# Patient Record
Sex: Male | Born: 1955 | Hispanic: Yes | Smoking: Former smoker
Health system: Southern US, Community
[De-identification: ages and names within clinical notes are randomized; demographics above are authoritative.]

## PROBLEM LIST (undated history)

## (undated) DIAGNOSIS — K219 Gastro-esophageal reflux disease without esophagitis: Secondary | ICD-10-CM

## (undated) DIAGNOSIS — E785 Hyperlipidemia, unspecified: Secondary | ICD-10-CM

## (undated) DIAGNOSIS — A048 Other specified bacterial intestinal infections: Secondary | ICD-10-CM

## (undated) DIAGNOSIS — E119 Type 2 diabetes mellitus without complications: Secondary | ICD-10-CM

## (undated) DIAGNOSIS — N400 Enlarged prostate without lower urinary tract symptoms: Secondary | ICD-10-CM

## (undated) DIAGNOSIS — M109 Gout, unspecified: Secondary | ICD-10-CM

## (undated) DIAGNOSIS — I10 Essential (primary) hypertension: Secondary | ICD-10-CM

## (undated) HISTORY — DX: Gout, unspecified: M10.9

## (undated) HISTORY — DX: Type 2 diabetes mellitus without complications: E11.9

## (undated) HISTORY — DX: Benign prostatic hyperplasia without lower urinary tract symptoms: N40.0

## (undated) HISTORY — DX: Hyperlipidemia, unspecified: E78.5

## (undated) HISTORY — DX: Essential (primary) hypertension: I10

## (undated) HISTORY — PX: NO PAST SURGERIES: SHX2092

## (undated) HISTORY — DX: Gastro-esophageal reflux disease without esophagitis: K21.9

## (undated) HISTORY — DX: Other specified bacterial intestinal infections: A04.8

---

## 2008-09-16 ENCOUNTER — Emergency Department: Payer: Self-pay | Admitting: Internal Medicine

## 2015-04-06 ENCOUNTER — Emergency Department: Payer: Self-pay

## 2015-04-06 ENCOUNTER — Encounter: Payer: Self-pay | Admitting: Medical Oncology

## 2015-04-06 ENCOUNTER — Emergency Department
Admission: EM | Admit: 2015-04-06 | Discharge: 2015-04-06 | Disposition: A | Discharge status: Home / Self Care | Payer: Self-pay | Attending: Emergency Medicine | Admitting: Emergency Medicine

## 2015-04-06 DIAGNOSIS — R0789 Other chest pain: Secondary | ICD-10-CM | POA: Insufficient documentation

## 2015-04-06 LAB — BASIC METABOLIC PANEL
Anion gap: 8 (ref 5–15)
BUN: 15 mg/dL (ref 6–20)
CO2: 26 mmol/L (ref 22–32)
CREATININE: 0.84 mg/dL (ref 0.61–1.24)
Calcium: 9.5 mg/dL (ref 8.9–10.3)
Chloride: 103 mmol/L (ref 101–111)
Glucose, Bld: 140 mg/dL — ABNORMAL HIGH (ref 65–99)
POTASSIUM: 4.6 mmol/L (ref 3.5–5.1)
SODIUM: 137 mmol/L (ref 135–145)

## 2015-04-06 LAB — CBC
HCT: 46.2 % (ref 40.0–52.0)
Hemoglobin: 15.5 g/dL (ref 13.0–18.0)
MCH: 30.2 pg (ref 26.0–34.0)
MCHC: 33.6 g/dL (ref 32.0–36.0)
MCV: 89.7 fL (ref 80.0–100.0)
PLATELETS: 127 10*3/uL — AB (ref 150–440)
RBC: 5.14 MIL/uL (ref 4.40–5.90)
RDW: 13.3 % (ref 11.5–14.5)
WBC: 11.6 10*3/uL — AB (ref 3.8–10.6)

## 2015-04-06 LAB — TROPONIN I

## 2015-04-06 MED ORDER — IBUPROFEN 400 MG PO TABS: 600.0000 mg | ORAL_TABLET | Freq: Once | ORAL | Status: DC

## 2015-04-06 MED ORDER — IBUPROFEN 600 MG PO TABS: 600.0000 mg | ORAL_TABLET | Freq: Four times a day (QID) | ORAL | Status: DC | PRN

## 2015-04-06 MED ORDER — CYCLOBENZAPRINE HCL 10 MG PO TABS
10.0000 mg | ORAL_TABLET | Freq: Once | ORAL | Status: AC
  Administered 2015-04-06: 10 mg via ORAL

## 2015-04-06 MED ORDER — CYCLOBENZAPRINE HCL 10 MG PO TABS
ORAL_TABLET | ORAL | Status: AC
  Administered 2015-04-06: 10 mg via ORAL
  Filled 2015-04-06: qty 1

## 2015-04-06 MED ORDER — CYCLOBENZAPRINE HCL 10 MG PO TABS: 10.0000 mg | ORAL_TABLET | Freq: Three times a day (TID) | ORAL | Status: AC | PRN

## 2015-04-06 MED ORDER — IBUPROFEN 600 MG PO TABS
ORAL_TABLET | ORAL | Status: AC
  Filled 2015-04-06: qty 1

## 2015-04-06 NOTE — ED Provider Notes (Signed)
[facility] Emergency Department Provider Note  Time seen: 5:12 PM  I have reviewed the triage vital signs and the nursing notes.   HISTORY  Chief Complaint rib pain ; Flank Pain; and Chest Pain    HPI Frederick Clarke is a 60 y.o. male with no past medical history who presents the emergency department left-sided rib pain. 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 States the pain went away, however approximately 2 hours later he developed pain once again to the area which worsened to an 8/10. Today he states the pain continued it was improved currently 5/10, but still present. A friend told the patient that it could be his gallbladder so he came to the emergency department for evaluation. Patient denies any nausea, trouble breathing, diaphoresis. States the pain is worse if he pushes on the ribs.     History reviewed. No pertinent past medical history.  There are no active problems to display for this patient.   History reviewed. No pertinent past surgical history.  No current outpatient prescriptions on file.  Allergies Review of patient's allergies indicates no known allergies.  No family history on file.  Social History Social History  Substance Use Topics  . Smoking status: Never Smoker   . Smokeless tobacco: None  . Alcohol Use: No    Review of Systems Constitutional: Negative for fever. Cardiovascular: Left chest wall pain Respiratory: Negative for shortness of breath. Gastrointestinal: Negative for abdominal pain Musculoskeletal: Negative for back pain. Left chest wall pain. Neurological: Negative for headache 10-point ROS otherwise negative.  ____________________________________________   PHYSICAL EXAM:  VITAL SIGNS: ED Triage Vitals  Enc Vitals Group     BP 04/06/15 1629 151/85 mmHg     Pulse Rate 04/06/15  1629 74     Resp 04/06/15 1629 20     Temp 04/06/15 1629 97.5 F (36.4 C)     Temp Source 04/06/15 1629 Oral     SpO2 04/06/15 1629 99 %     Weight 04/06/15 1629 200 lb (90.719 kg)     Height 04/06/15 1629  (1.676 m)     Head Cir --      Peak Flow --      Pain Score 04/06/15 1630 7     Pain Loc --      Pain Edu? --      Excl. in GC? --     Constitutional: Alert and oriented. Well appearing and in no distress. Eyes: Normal exam ENT   Head: Normocephalic and atraumatic.   Mouth/Throat: Mucous membranes are moist. Cardiovascular: Normal rate, regular rhythm. No murmur Respiratory: Normal respiratory effort without tachypnea nor retractions. Breath sounds are clear. Moderate tenderness palpation to the left lateral lower rib. Gastrointestinal: Soft and nontender. No distention. Musculoskeletal: Nontender with normal range of motion in all extremities.  Neurologic:  Normal speech and language. No gross focal neurologic deficits Skin:  Skin is warm, dry and intact.  Psychiatric: Mood and affect are normal. Speech and behavior are normal.  ____________________________________________    EKG  EKG reviewed and interpreted by myself shows normal sinus rhythm at 70 bpm, narrow QRS, normal axis, normal intervals, no ST changes. Normal EKG.  ____________________________________________    RADIOLOGY  Chest x-ray shows no acute abnormality  INITIAL IMPRESSION / ASSESSMENT AND PLAN / ED COURSE  Pertinent labs & imaging results  that were available during my care of the patient were reviewed by me and considered in my medical decision making (see chart for details).  Patient presents with left lateral chest wall pain. Started yesterday when he lifted a heavy piece of carpet that is worsened overnight and continued today so he came to the emergency department for evaluation. He states a friend of his was concerned it could be the gallbladder however all the patient's pain is on  the left lateral ribs. It is moderately tender to palpation. Worse with movement per patient. Story and exam are very consistent with musculoskeletal pain. We'll dose Flexeril and Motrin. We will check labs as a precaution, and obtain a chest x-ray. EKG is reassuring.  Labs are within normal limits. We'll discharge patient home with Flexeril and ibuprofen. Likely musculoskeletal pain. Patient follow-up with primary care physician if the pain is not improved in 2-3 days. I discussed the normal chest pain return precautions with the patient.  ____________________________________________   FINAL CLINICAL IMPRESSION(S) / ED DIAGNOSES  Chest wall pain Musculoskeletal pain   Minna AntisKevin Kristofer Schaffert, MD 04/06/15 [phone]

## 2015-04-06 NOTE — ED Notes (Signed)
Pt discharged home after verbalizing understanding of discharge instructions; nad noted. 

## 2015-04-06 NOTE — Discharge Instructions (Signed)
You have been seen in the emergency department today for chest pain. Your workup has shown normal results, and your pain is likely due to muscle strain/spasm. As we discussed please follow-up with your primary care physician in the next 1-2 days for recheck. Return to the emergency department for any further chest pain, trouble breathing, or any other symptom personally concerning to yourself.  Chest Wall Pain Chest wall pain is pain in or around the bones and muscles of your chest. Sometimes, an injury causes this pain. Sometimes, the cause may not be known. This pain may take several weeks or longer to get better. HOME CARE INSTRUCTIONS  Pay attention to any changes in your symptoms. Take these actions to help with your pain:   Rest as told by your health care provider.   Avoid activities that cause pain. These include any activities that use your chest muscles or your abdominal and side muscles to lift heavy items.   If directed, apply ice to the painful area:  Put ice in a plastic bag.  Place a towel between your skin and the bag.  Leave the ice on for 20 minutes, 2-3 times per day.  Take over-the-counter and prescription medicines only as told by your health care provider.  Do not use tobacco products, including cigarettes, chewing tobacco, and e-cigarettes. If you need help quitting, ask your health care provider.  Keep all follow-up visits as told by your health care provider. This is important. SEEK MEDICAL CARE IF:  You have a fever.  Your chest pain becomes worse.  You have new symptoms. SEEK IMMEDIATE MEDICAL CARE IF:  You have nausea or vomiting.  You feel sweaty or light-headed.  You have a cough with phlegm (sputum) or you cough up blood.  You develop shortness of breath.   This information is not intended to replace advice given to you by your health care provider. Make sure you discuss any questions you have with your health care provider.   Document  Released: 01/08/2005 Document Revised: 09/29/2014 Document Reviewed: 04/05/2014 Elsevier Interactive Patient Education [employer]2016 Elsevier Inc.

## 2015-04-06 NOTE — ED Notes (Signed)
Pt reports left sided rib/flank pain that began today. Pt denies sob.

## 2015-04-06 NOTE — ED Notes (Signed)
Pt reports pain in left ribcage that was 8/10, but is now 5/10. States he also has numbness in tip of his tongue. Pt denies injury or trauma. NAD noted.

## 2020-10-13 ENCOUNTER — Encounter: Payer: Self-pay | Admitting: Gastroenterology

## 2020-10-27 ENCOUNTER — Ambulatory Visit: Payer: Self-pay | Admitting: Gastroenterology

## 2020-12-05 ENCOUNTER — Other Ambulatory Visit: Payer: Self-pay

## 2020-12-05 ENCOUNTER — Telehealth: Payer: Self-pay | Admitting: Gastroenterology

## 2020-12-05 ENCOUNTER — Encounter: Payer: Self-pay | Admitting: Gastroenterology

## 2020-12-05 ENCOUNTER — Ambulatory Visit (INDEPENDENT_AMBULATORY_CARE_PROVIDER_SITE_OTHER): Payer: 59 | Admitting: Gastroenterology

## 2020-12-05 VITALS — BP 120/64 | HR 64 | Ht 66.0 in | Wt 184.1 lb

## 2020-12-05 DIAGNOSIS — Z1212 Encounter for screening for malignant neoplasm of rectum: Secondary | ICD-10-CM

## 2020-12-05 DIAGNOSIS — R140 Abdominal distension (gaseous): Secondary | ICD-10-CM

## 2020-12-05 DIAGNOSIS — Z1211 Encounter for screening for malignant neoplasm of colon: Secondary | ICD-10-CM

## 2020-12-05 MED ORDER — POLYETHYLENE GLYCOL 3350 17 GM/SCOOP PO POWD: 1.0000 | Freq: Every day | ORAL | 3 refills | Status: DC

## 2020-12-05 MED ORDER — SUTAB 1479-225-188 MG PO TABS: 1.0000 | ORAL_TABLET | Freq: Once | ORAL | 0 refills | Status: AC

## 2020-12-05 NOTE — Patient Instructions (Signed)
If you are age 64 or older, your body mass index should be between 23-30. Your Body mass index is 29.72 kg/m. If this is out of the aforementioned range listed, please consider follow up with your Primary Care Provider.  If you are age 47 or younger, your body mass index should be between 19-25. Your Body mass index is 29.72 kg/m. If this is out of the aformentioned range listed, please consider follow up with your Primary Care Provider.   You have been scheduled for an endoscopy and colonoscopy. Please follow the written instructions given to you at your visit today. Please pick up your prep supplies at the pharmacy within the next 1-3 days. If you use inhalers (even only as needed), please bring them with you on the day of your procedure.   We have sent the following medications to your pharmacy for you to pick up at your convenience: Miralax 1 capful daily.  Start over the counter IB Guard.  The [facility] providers would like to encourage you to use [facility] to communicate with providers for non-urgent requests or questions.  Due to long hold times on the telephone, sending your provider a message by [facility] may be a faster and more efficient way to get a response.  Please allow 48 business hours for a response.  Please remember that this is for non-urgent requests.   It was a pleasure to see you today!  Thank you for trusting me with your gastrointestinal care!    Scott E.Tomasa Rand, MD

## 2020-12-05 NOTE — Progress Notes (Signed)
HPI : Frederick Clarke is a very pleasant 65 year old male with a history of diabetes (diet-controlled) referred to [place] by [facility] for further evaluation of abdominal bloating.  At the time of his visit, I have no records to review.  No documents were sent with his referral.  Per the patient, he was diagnosed with H. pylori about 2 months ago, and was treated with multiple antibiotics and reportedly had a negative stool antigen afterwards.  He has been having problems with abdominal bloating and shortness of breath for many months.  The symptoms are more prominent in the evening time when he is laying down.  He feels he cannot breathe well when he is laying flat and has to walk around at night.  Sometimes he feels like he is short of breath with walking around as well.  Although he feels bloated, he denies significant abdominal distention.  His belly is usually flat, although he notes that there is asymmetry of his abdomen, with perceived swelling on the right side. When asked if he has had a cardiac evaluation, he states that EGD was performed by his primary provider and was abnormal, and he is being referred to cardiology.  He has an appointment with them later this week. Although he reports difficulty with shortness of breath supine, he denies significant dyspnea with activity. He has had some problems with constipation, and is taking a fiber pill twice a day and takes milk of magnesia on occasion which works well for him.  It appears he was prescribed Linzess (listed medication) but he says he did not take this due to concern of side effects.  He takes Prilosec on occasion and thinks this may help some. The patient has lost about 20 pounds through improved diet which was recommended to control his diabetes. He has never had a colonoscopy or upper endoscopy.  His father had cancer in his liver, but he is not sure if it started there or in his stomach or  elsewhere.   Past Medical History:  Diagnosis Date   BPH (benign prostatic hyperplasia)    GERD (gastroesophageal reflux disease)    Gout    H. pylori infection    Hyperlipidemia    Type 2 diabetes mellitus (HCC)      Past Surgical History:  Procedure Laterality Date   NO PAST SURGERIES     Family History  Problem Relation Age of Onset   Diabetes Mother    Hypertension Mother    Liver cancer Father    Lung cancer Brother    Heart disease Daughter    Social History   Tobacco Use   Smoking status: Former    Types: Cigarettes    Quit date: 1993    Years since quitting: 29.8   Smokeless tobacco: Never  Vaping Use   Vaping Use: Never used  Substance Use Topics   Alcohol use: Yes    Comment: occasional   Current Outpatient Medications  Medication Sig Dispense Refill   tamsulosin (FLOMAX) 0.4 MG CAPS capsule Take 0.4 mg by mouth daily.     No current facility-administered medications for this visit.   Allergies  Allergen Reactions   Pineapple Itching     Review of Systems: All systems reviewed and negative except where noted in HPI.    No results found.  Physical Exam: BP 120/64 (BP Location: Left Arm, Patient Position: Sitting, Cuff Size: Normal)   Pulse 64   Ht 5\' 6"  (1.676 m)  Comment: from previous height  Wt 184 lb 2 oz (83.5 kg)   BMI 29.72 kg/m  Constitutional: Pleasant,well-developed, Frederick Clarke male in no acute distress. HEENT: Normocephalic and atraumatic. Conjunctivae are normal. No scleral icterus. Cardiovascular: Normal rate, regular rhythm.  Pulmonary/chest: Effort normal and breath sounds normal. No wheezing, rales or rhonchi. Abdominal: Soft, nondistended, nontender.  Very subtle asymmetry of the right and left abdomen.  Bowel sounds active throughout. There are no masses palpable. No hepatomegaly. Extremities: no edema Neurological: Alert and oriented to person place and time. Skin: Skin is warm and dry. No rashes noted. Psychiatric:  Normal mood and affect. Behavior is normal.  CBC    Component Value Date/Time   WBC 11.6 (H) 04/06/2015 1631   RBC 5.14 04/06/2015 1631   HGB 15.5 04/06/2015 1631   HCT 46.2 04/06/2015 1631   PLT 127 (L) 04/06/2015 1631   MCV 89.7 04/06/2015 1631   MCH 30.2 04/06/2015 1631   MCHC 33.6 04/06/2015 1631   RDW 13.3 04/06/2015 1631    CMP     Component Value Date/Time   NA 137 04/06/2015 1631   K 4.6 04/06/2015 1631   CL 103 04/06/2015 1631   CO2 26 04/06/2015 1631   GLUCOSE 140 (H) 04/06/2015 1631   BUN 15 04/06/2015 1631   CREATININE 0.84 04/06/2015 1631   CALCIUM 9.5 04/06/2015 1631   GFRNONAA >60 04/06/2015 1631   GFRAA >60 04/06/2015 1631     ASSESSMENT AND PLAN: 65 year old male with several months of bothersome bloating and orthopnea.  Unclear if the bloating is directly related to his breathing difficulty, given the absence of abdominal distention.  I have a hard time attributing all of his breathing difficulties solely on intestinal gas.  I recommended he try taking some IBgard in the evenings before bed and improve his bowel regimen by adding daily MiraLAX in the morning.  Continue taking his fiber supplements as he is doing.  He needs a colonoscopy for colon cancer screening, but given his new onset upper GI symptoms, I also recommend an upper endoscopy to exclude dysplasia and reconfirm eradication of H. pylori.  We will request records from his primary provider for further details about his medical history and previous medications.  Given the concern for a possible underlying cardiac problem (dyspnea with abnormal EKG), I recommended we not perform his endoscopic procedures until his cardiac evaluation is complete.  Bloating - IBgard nightly - MiraLAX daily - EGD  Colon cancer screening - Screening colonoscopy  Dyspnea/abnormal EKG - Obtain records from primary care - Await cardiology recommendations - Endoscopic procedures to be performed after cardiac  evaluation completed  The details, risks (including bleeding, perforation, infection, missed lesions, medication reactions and possible hospitalization or surgery if complications occur), benefits, and alternatives to colonoscopy with possible biopsy and possible polypectomy were discussed with the patient and he consents to proceed.   Maricella Filyaw E. Tomasa Rand, MD [facility]   No ref. provider found

## 2020-12-05 NOTE — Telephone Encounter (Signed)
Frederick Clarke, from [employer], returned your call.  He said to please try the following number:  [phone]   And that hopefully you'd get through easier.  Thank you.

## 2020-12-05 NOTE — Telephone Encounter (Signed)
Spoke with pharmacy and clarified instructions for miralax.

## 2020-12-05 NOTE — Telephone Encounter (Signed)
Received a call from [facility] requesting clarification on dosage of Miralax prescription sent.  Please call.  [phone] or [phone]  Thank you.

## 2020-12-08 ENCOUNTER — Ambulatory Visit: Payer: 59 | Admitting: Cardiology

## 2020-12-08 ENCOUNTER — Other Ambulatory Visit: Payer: Self-pay

## 2020-12-08 ENCOUNTER — Encounter: Payer: Self-pay | Admitting: Cardiology

## 2020-12-08 VITALS — BP 142/80 | HR 68 | Ht 66.0 in | Wt 183.0 lb

## 2020-12-08 DIAGNOSIS — R072 Precordial pain: Secondary | ICD-10-CM | POA: Diagnosis not present

## 2020-12-08 DIAGNOSIS — R0609 Other forms of dyspnea: Secondary | ICD-10-CM

## 2020-12-08 DIAGNOSIS — R030 Elevated blood-pressure reading, without diagnosis of hypertension: Secondary | ICD-10-CM | POA: Diagnosis not present

## 2020-12-08 DIAGNOSIS — R3589 Other polyuria: Secondary | ICD-10-CM | POA: Diagnosis not present

## 2020-12-08 MED ORDER — METOPROLOL TARTRATE 100 MG PO TABS: 100.0000 mg | ORAL_TABLET | Freq: Once | ORAL | 0 refills | Status: DC

## 2020-12-08 NOTE — Patient Instructions (Signed)
Medication Instructions:  Your physician recommends that you continue on your current medications as directed. Please refer to the Current Medication list given to you today.  *If you need a refill on your cardiac medications before your next appointment, please call your pharmacy*   Lab Work:  BMP drawn in office today    Testing/Procedures:   Your physician has requested that you have an echocardiogram. Echocardiography is a painless test that uses sound waves to create images of your heart. It provides your doctor with information about the size and shape of your heart and how well your heart's chambers and valves are working. This procedure takes approximately one hour. There are no restrictions for this procedure.  2.  Your physician has requested that you have cardiac CT. Cardiac computed tomography (CT) is a painless test that uses an x-ray machine to take clear, detailed pictures of your heart.    Your cardiac CT will be scheduled at:  [facility] [address] [place], [place] [address] [phone]   Thursday December 1st at 10:15   Please arrive 15 mins early for check-in and test prep.   Please follow these instructions carefully (unless otherwise directed):    On the Night Before the Test: Be sure to Drink plenty of water. Do not consume any caffeinated/decaffeinated beverages or chocolate 12 hours prior to your test.    On the Day of the Test: Drink plenty of water until 1 hour prior to the test. Do not eat any food 4 hours prior to the test. You may take your regular medications prior to the test.  Take metoprolol (Lopressor) 100 MG two hours prior to test.    After the Test: Drink plenty of water. After receiving IV contrast, you may experience a mild flushed feeling. This is normal. On occasion, you may experience a mild rash up to 24 hours after the test. This is not dangerous. If this occurs, you  can take Benadryl 25 mg and increase your fluid intake. If you experience trouble breathing, this can be serious. If it is severe call 911 IMMEDIATELY. If it is mild, please call our office. If you take any of these medications: Glipizide/Metformin, Avandament, Glucavance, please do not take 48 hours after completing test unless otherwise instructed.  Please allow 2-4 weeks for scheduling of routine cardiac CTs. Some insurance companies require a pre-authorization which may delay scheduling of this test.   For non-scheduling related questions, please contact the cardiac imaging nurse navigator should you have any questions/concerns: Rockwell Alexandria, Cardiac Imaging Nurse Navigator Larey Brick, Cardiac Imaging Nurse Navigator [facility] Direct Office Dial: [phone]   For scheduling needs, including cancellations and rescheduling, please call [place], [phone].      Follow-Up: At [facility], you and your health needs are our priority.  As part of our continuing mission to provide you with exceptional heart care, we have created designated Provider Care Teams.  These Care Teams include your primary Cardiologist (physician) and Advanced Practice Providers (APPs -  Physician Assistants and Nurse Practitioners) who all work together to provide you with the care you need, when you need it.  We recommend signing up for the patient portal called "MyChart".  Sign up information is provided on this After Visit Summary.  MyChart is used to connect with patients for Virtual Visits (Telemedicine).  Patients are able to view lab/test results, encounter notes, upcoming appointments, etc.  Non-urgent messages can be sent  to your provider as well.   To learn more about what you can do with MyChart, go to [email or web address].    Your next appointment:    Follow up after testing  ([facility] on 12/22/20)  The format for your next appointment:   In Person  Provider:    You may see Dr. Azucena Cecil or one of the following Advanced Practice Providers on your designated Care Team:   Nicolasa Ducking, NP Eula Listen, PA-C Cadence Fransico Michael, [place]    Other Instructions

## 2020-12-08 NOTE — Progress Notes (Signed)
Cardiology Office Note:    Date:  12/08/2020   ID:  Frederick Clarke, DOB [birth year], MRN [id number]  PCP:  Patient, No Pcp Per (Inactive)   [facility] Providers Cardiologist:  None     Referring MD: [name], PA   Chief Complaint  Patient presents with   New Patient (Initial Visit)    Referred by PCP for chest pain. Patient c.o SOB. Meds reviewed verbally with patient.    Frederick Clarke is a 65 y.o. male who is being seen today for the evaluation of chest pain at the request of [name], [place].   History of Present Illness:    Frederick Clarke is a 65 y.o. male with a hx of diabetes, former smoker x15 years who presents due to chest pain and shortness of breath.  Patient recently diagnosed with H. pylori about 3 months ago.  Underwent 2-week treatment with antibiotics.  States having shortness of breath with minimal exertion prior to abdominal distention or bloating leading to diagnosis of H. pylori.  Shortness of breath has persisted despite treatment with antibiotics.  He also has left-sided chest discomfort not associated with exertion.  Sometimes reproducible with palpation and sometimes not.   Takes metformin for diabetes control.  States blood sugars typically in the 300 range, endorses urinating very frequenly.   Past Medical History:  Diagnosis Date   BPH (benign prostatic hyperplasia)    GERD (gastroesophageal reflux disease)    Gout    H. pylori infection    Hyperlipidemia    Type 2 diabetes mellitus (HCC)     Past Surgical History:  Procedure Laterality Date   NO PAST SURGERIES      Current Medications: Current Meds  Medication Sig   metFORMIN (GLUCOPHAGE) 500 MG tablet Take 500 mg by mouth 2 (two) times daily with a meal.   metoprolol tartrate (LOPRESSOR) 100 MG tablet Take 1 tablet (100 mg total) by mouth once for 1 dose. Take 2 hours prior to your CT scan.   polyethylene glycol (MIRALAX / GLYCOLAX) 17 g packet Take 17 g by mouth daily  as needed.     Allergies:   Pineapple   Social History   Socioeconomic History   Marital status: Divorced    Spouse name: Not on file   Number of children: 6   Years of education: Not on file   Highest education level: Not on file  Occupational History   Occupation: carpet  Tobacco Use   Smoking status: Former    Types: Cigarettes    Quit date: 1993    Years since quitting: 29.8   Smokeless tobacco: Never  Vaping Use   Vaping Use: Never used  Substance and Sexual Activity   Alcohol use: Yes    Comment: occasional   Drug use: Not on file   Sexual activity: Not on file  Other Topics Concern   Not on file  Social History Narrative   Not on file   Social Determinants of Health   Financial Resource Strain: Not on file  Food Insecurity: Not on file  Transportation Needs: Not on file  Physical Activity: Not on file  Stress: Not on file  Social Connections: Not on file     Family History: The patient's family history includes Diabetes in his mother; Heart disease in his daughter; Hypertension in his mother; Liver cancer in his father; Lung cancer in his brother.  ROS:   Please see the history of present illness.     All  other systems reviewed and are negative.  EKGs/Labs/Other Studies Reviewed:    The following studies were reviewed today:   EKG:  EKG is  ordered today.  The ekg ordered today demonstrates normal sinus rhythm, normal ECG.  Recent Labs: No results found for requested labs within last 8760 hours.  Recent Lipid Panel No results found for: CHOL, TRIG, HDL, CHOLHDL, VLDL, LDLCALC, LDLDIRECT   Risk Assessment/Calculations:          Physical Exam:    VS:  BP (!) 142/80 (BP Location: Left Arm, Patient Position: Sitting, Cuff Size: Normal)   Pulse 68   Ht 5\' 6"  (1.676 m)   Wt 183 lb (83 kg)   SpO2 94%   BMI 29.54 kg/m     Wt Readings from Last 3 Encounters:  12/08/20 183 lb (83 kg)  12/05/20 184 lb 2 oz (83.5 kg)  04/06/15 200 lb (90.7  kg)     GEN:  Well nourished, well developed in no acute distress HEENT: Normal NECK: No JVD; No carotid bruits LYMPHATICS: No lymphadenopathy CARDIAC: RRR, no murmurs, rubs, gallops RESPIRATORY:  Clear to auscultation without rales, wheezing or rhonchi  ABDOMEN: Soft, non-tender, non-distended MUSCULOSKELETAL:  No edema; No deformity  SKIN: Warm and dry NEUROLOGIC:  Alert and oriented x 3 PSYCHIATRIC:  Normal affect   ASSESSMENT:    1. Precordial pain   2. Dyspnea on exertion   3. Polyuria   4. Elevated BP without diagnosis of hypertension    PLAN:    In order of problems listed above:  Chest pain, risk factors of diabetes, former smoker.  Get echocardiogram, get coronary CTA to evaluate presence of CAD.  If coronary CTA not approved by insurance, will plan Lexiscan Myoview. Dyspnea on exertion, echo and coronary CTA as above. Polyuria, elevated blood sugars.  Adequate management of diabetes as per PCP or endocrinology. BP elevated, usually controlled.  Continue to monitor.  Follow-up after echo and coronary CTA.  Spanish interpreter used for this office visit.         Medication Adjustments/Labs and Tests Ordered: Current medicines are reviewed at length with the patient today.  Concerns regarding medicines are outlined above.  Orders Placed This Encounter  Procedures   CT CORONARY MORPH W/CTA COR W/SCORE W/CA W/CM &/OR WO/CM   Basic metabolic panel   EKG 12-Lead   ECHOCARDIOGRAM COMPLETE   Meds ordered this encounter  Medications   metoprolol tartrate (LOPRESSOR) 100 MG tablet    Sig: Take 1 tablet (100 mg total) by mouth once for 1 dose. Take 2 hours prior to your CT scan.    Dispense:  1 tablet    Refill:  0    Patient Instructions  Medication Instructions:  Your physician recommends that you continue on your current medications as directed. Please refer to the Current Medication list given to you today.  *If you need a refill on your cardiac  medications before your next appointment, please call your pharmacy*   Lab Work:  BMP drawn in office today    Testing/Procedures:   Your physician has requested that you have an echocardiogram. Echocardiography is a painless test that uses sound waves to create images of your heart. It provides your doctor with information about the size and shape of your heart and how well your heart's chambers and valves are working. This procedure takes approximately one hour. There are no restrictions for this procedure.  2.  Your physician has requested that you have cardiac CT.  Cardiac computed tomography (CT) is a painless test that uses an x-ray machine to take clear, detailed pictures of your heart.    Your cardiac CT will be scheduled at:  [facility] [address] [place], [place] [address] [phone]   Thursday December 1st at 10:15   Please arrive 15 mins early for check-in and test prep.   Please follow these instructions carefully (unless otherwise directed):    On the Night Before the Test: Be sure to Drink plenty of water. Do not consume any caffeinated/decaffeinated beverages or chocolate 12 hours prior to your test.    On the Day of the Test: Drink plenty of water until 1 hour prior to the test. Do not eat any food 4 hours prior to the test. You may take your regular medications prior to the test.  Take metoprolol (Lopressor) 100 MG two hours prior to test.    After the Test: Drink plenty of water. After receiving IV contrast, you may experience a mild flushed feeling. This is normal. On occasion, you may experience a mild rash up to 24 hours after the test. This is not dangerous. If this occurs, you can take Benadryl 25 mg and increase your fluid intake. If you experience trouble breathing, this can be serious. If it is severe call 911 IMMEDIATELY. If it is mild, please call our office. If you take any of these  medications: Glipizide/Metformin, Avandament, Glucavance, please do not take 48 hours after completing test unless otherwise instructed.  Please allow 2-4 weeks for scheduling of routine cardiac CTs. Some insurance companies require a pre-authorization which may delay scheduling of this test.   For non-scheduling related questions, please contact the cardiac imaging nurse navigator should you have any questions/concerns: Rockwell Alexandria, Cardiac Imaging Nurse Navigator Larey Brick, Cardiac Imaging Nurse Navigator  [facility] Direct Office Dial: [phone]   For scheduling needs, including cancellations and rescheduling, please call [place], [phone].      Follow-Up: At [facility], you and your health needs are our priority.  As part of our continuing mission to provide you with exceptional heart care, we have created designated Provider Care Teams.  These Care Teams include your primary Cardiologist (physician) and Advanced Practice Providers (APPs -  Physician Assistants and Nurse Practitioners) who all work together to provide you with the care you need, when you need it.  We recommend signing up for the patient portal called "MyChart".  Sign up information is provided on this After Visit Summary.  MyChart is used to connect with patients for Virtual Visits (Telemedicine).  Patients are able to view lab/test results, encounter notes, upcoming appointments, etc.  Non-urgent messages can be sent to your provider as well.   To learn more about what you can do with MyChart, go to [email or web address].    Your next appointment:    Follow up after testing  (CCTA on 12/22/20)  The format for your next appointment:   In Person  Provider:   You may see Dr. Azucena Cecil or one of the following Advanced Practice Providers on your designated Care Team:   Nicolasa Ducking, NP Eula Listen, PA-C Cadence Fransico Michael, [place]    Other Instructions    Signed, Debbe Odea, MD  12/08/2020 11:56 AM    [facility]

## 2020-12-09 ENCOUNTER — Other Ambulatory Visit: Payer: Self-pay

## 2020-12-09 ENCOUNTER — Emergency Department (HOSPITAL_COMMUNITY)
Admission: EM | Admit: 2020-12-09 | Discharge: 2020-12-10 | Disposition: A | Discharge status: Home / Self Care | Payer: 59 | Attending: Emergency Medicine | Admitting: Emergency Medicine

## 2020-12-09 ENCOUNTER — Encounter (HOSPITAL_COMMUNITY): Payer: Self-pay | Admitting: Emergency Medicine

## 2020-12-09 DIAGNOSIS — Z87891 Personal history of nicotine dependence: Secondary | ICD-10-CM | POA: Insufficient documentation

## 2020-12-09 DIAGNOSIS — R739 Hyperglycemia, unspecified: Secondary | ICD-10-CM

## 2020-12-09 DIAGNOSIS — Z7984 Long term (current) use of oral hypoglycemic drugs: Secondary | ICD-10-CM | POA: Insufficient documentation

## 2020-12-09 DIAGNOSIS — E1165 Type 2 diabetes mellitus with hyperglycemia: Secondary | ICD-10-CM | POA: Insufficient documentation

## 2020-12-09 DIAGNOSIS — R1013 Epigastric pain: Secondary | ICD-10-CM | POA: Insufficient documentation

## 2020-12-09 LAB — BASIC METABOLIC PANEL
BUN/Creatinine Ratio: 19 (ref 10–24)
BUN: 16 mg/dL (ref 8–27)
CO2: 24 mmol/L (ref 20–29)
Calcium: 9.9 mg/dL (ref 8.6–10.2)
Chloride: 96 mmol/L (ref 96–106)
Creatinine, Ser: 0.85 mg/dL (ref 0.76–1.27)
Glucose: 312 mg/dL — ABNORMAL HIGH (ref 70–99)
Potassium: 4.7 mmol/L (ref 3.5–5.2)
Sodium: 136 mmol/L (ref 134–144)
eGFR: 97 mL/min/{1.73_m2} (ref >59)

## 2020-12-09 LAB — COMPREHENSIVE METABOLIC PANEL
ALT: 17 U/L (ref 0–44)
AST: 19 U/L (ref 15–41)
Albumin: 4.3 g/dL (ref 3.5–5.0)
Alkaline Phosphatase: 117 U/L (ref 38–126)
Anion gap: 10 (ref 5–15)
BUN: 20 mg/dL (ref 8–23)
CO2: 26 mmol/L (ref 22–32)
Calcium: 9.5 mg/dL (ref 8.9–10.3)
Chloride: 97 mmol/L — ABNORMAL LOW (ref 98–111)
Creatinine, Ser: 0.72 mg/dL (ref 0.61–1.24)
GFR, Estimated: >60 mL/min (ref >60)
Glucose, Bld: 246 mg/dL — ABNORMAL HIGH (ref 70–99)
Potassium: 3.7 mmol/L (ref 3.5–5.1)
Sodium: 133 mmol/L — ABNORMAL LOW (ref 135–145)
Total Bilirubin: 0.6 mg/dL (ref 0.3–1.2)
Total Protein: 8 g/dL (ref 6.5–8.1)

## 2020-12-09 LAB — CBG MONITORING, ED: Glucose-Capillary: 251 mg/dL — ABNORMAL HIGH (ref 70–99)

## 2020-12-09 LAB — URINALYSIS, ROUTINE W REFLEX MICROSCOPIC
Bacteria, UA: NONE SEEN
Bilirubin Urine: NEGATIVE
Glucose, UA: >=500 mg/dL — AB
Hgb urine dipstick: NEGATIVE
Ketones, ur: 5 mg/dL — AB
Nitrite: NEGATIVE
Protein, ur: NEGATIVE mg/dL
Specific Gravity, Urine: 1.017 (ref 1.005–1.030)
pH: 5 (ref 5.0–8.0)

## 2020-12-09 LAB — CBC WITH DIFFERENTIAL/PLATELET
Abs Immature Granulocytes: 0.01 10*3/uL (ref 0.00–0.07)
Basophils Absolute: 0 10*3/uL (ref 0.0–0.1)
Basophils Relative: 0 %
Eosinophils Absolute: 0 10*3/uL (ref 0.0–0.5)
Eosinophils Relative: 1 %
HCT: 44.6 % (ref 39.0–52.0)
Hemoglobin: 15.6 g/dL (ref 13.0–17.0)
Immature Granulocytes: 0 %
Lymphocytes Relative: 40 %
Lymphs Abs: 2.9 10*3/uL (ref 0.7–4.0)
MCH: 30.3 pg (ref 26.0–34.0)
MCHC: 35 g/dL (ref 30.0–36.0)
MCV: 86.6 fL (ref 80.0–100.0)
Monocytes Absolute: 0.6 10*3/uL (ref 0.1–1.0)
Monocytes Relative: 8 %
Neutro Abs: 3.7 10*3/uL (ref 1.7–7.7)
Neutrophils Relative %: 51 %
Platelets: 155 10*3/uL (ref 150–400)
RBC: 5.15 MIL/uL (ref 4.22–5.81)
RDW: 12.6 % (ref 11.5–15.5)
WBC: 7.3 10*3/uL (ref 4.0–10.5)
nRBC: 0 % (ref 0.0–0.2)

## 2020-12-09 NOTE — ED Triage Notes (Signed)
PT reports elevated CBG around 300 for 2 weeks. States recently treated for H. Pylori.

## 2020-12-09 NOTE — ED Provider Notes (Signed)
Emergency Medicine Provider Triage Evaluation Note  Frederick Clarke , a 65 y.o. male  was evaluated in triage.  Pt complains of urinary frequency and weak urinary stream. Sxs for 3-4 days. BGLs have been elevated in 300s. Taking metformin as prescribed. Also reports 5 lb weight loss in last few days.   Review of Systems  Positive: Urinary frequency Negative: fever  Physical Exam  BP (!) 149/93 (BP Location: Left Arm)   Pulse 67   Resp 18   SpO2 96%  Gen:   Awake, no distress   Resp:  Normal effort  MSK:   Moves extremities without difficulty  Other:  No ttp of abd  Medical Decision Making  Medically screening exam initiated at 10:25 PM.  Appropriate orders placed.  Frederick Clarke was informed that the remainder of the evaluation will be completed by another provider, this initial triage assessment does not replace that evaluation, and the importance of remaining in the ED until their evaluation is complete.  Labs, ua   [place], [place], PA-C 12/09/20 2227    Virgina Norfolk, DO 12/09/20 2232

## 2020-12-10 MED ORDER — OMEPRAZOLE 20 MG PO CPDR: 20.0000 mg | DELAYED_RELEASE_CAPSULE | Freq: Every day | ORAL | 0 refills | Status: AC

## 2020-12-10 MED ORDER — ONDANSETRON 4 MG PO TBDP: 4.0000 mg | ORAL_TABLET | Freq: Three times a day (TID) | ORAL | 0 refills | Status: AC | PRN

## 2020-12-10 NOTE — ED Provider Notes (Signed)
[facility]-EMERGENCY DEPT Provider Note   CSN: [id number] Arrival date & time: 12/09/20  2158     History Chief Complaint  Patient presents with   Hyperglycemia    Frederick Clarke is a 65 y.o. male.  HPI     This is a 65 year old male with a history of type 2 diabetes, reflux, H. pylori who presents with concerns for high blood sugar.  Patient reports that over the last week his blood sugars have been consistently in the 300 range.  He takes 1000 mg of metformin twice daily.  He called his primary physician but "they could not see me."  He reports polyuria.  He has been drinking fluids with electrolytes because he is peeing so frequently.  He denies chest pain or shortness of breath but does report some persistent epigastric discomfort.  He recently was treated for H. pylori infection.  He states that he finished treatment and was told that "the infection was gone."  He is not currently on any medications including PPI.  He states that he feels very full and when he eats he has some discomfort.  He states he has lost 4 pounds because of this.  Denies vomiting or diarrhea.  No recent fevers.  Chart reviewed.  He has seen both gastroenterology and recently as of 11/17 saw cardiology.  Cardiology recommended outpatient echo and CT coronary study.    Past Medical History:  Diagnosis Date   BPH (benign prostatic hyperplasia)    GERD (gastroesophageal reflux disease)    Gout    H. pylori infection    Hyperlipidemia    Type 2 diabetes mellitus (HCC)     There are no problems to display for this patient.   Past Surgical History:  Procedure Laterality Date   NO PAST SURGERIES         Family History  Problem Relation Age of Onset   Diabetes Mother    Hypertension Mother    Liver cancer Father    Lung cancer Brother    Heart disease Daughter     Social History   Tobacco Use   Smoking status: Former    Types: Cigarettes    Quit date: 1993     Years since quitting: 29.9   Smokeless tobacco: Never  Vaping Use   Vaping Use: Never used  Substance Use Topics   Alcohol use: Yes    Comment: occasional    Home Medications Prior to Admission medications   Medication Sig Start Date End Date Taking? Authorizing Provider  omeprazole (PRILOSEC) 20 MG capsule Take 1 capsule (20 mg total) by mouth daily. 12/10/20  Yes Dyron Kawano, Mayer Masker, MD  ondansetron (ZOFRAN ODT) 4 MG disintegrating tablet Take 1 tablet (4 mg total) by mouth every 8 (eight) hours as needed for nausea or vomiting. 12/10/20  Yes Journey Castonguay, Mayer Masker, MD  metFORMIN (GLUCOPHAGE) 500 MG tablet Take 500 mg by mouth 2 (two) times daily with a meal.    [provider]  metoprolol tartrate (LOPRESSOR) 100 MG tablet Take 1 tablet (100 mg total) by mouth once for 1 dose. Take 2 hours prior to your CT scan. 12/08/20 12/08/20  Debbe Odea, MD  polyethylene glycol (MIRALAX / GLYCOLAX) 17 g packet Take 17 g by mouth daily as needed.    [provider]  tamsulosin (FLOMAX) 0.4 MG CAPS capsule Take 0.4 mg by mouth daily. Patient not taking: Reported on 12/08/2020 10/18/20   [provider]    Allergies  Pineapple  Review of Systems   Review of Systems  Constitutional:  Negative for fever.  Respiratory:  Negative for shortness of breath.   Cardiovascular:  Negative for chest pain.  Gastrointestinal:  Positive for abdominal pain and nausea. Negative for vomiting.  Endocrine: Positive for polyuria.  Genitourinary:  Negative for dysuria.  All other systems reviewed and are negative.  Physical Exam Updated Vital Signs BP 132/90 (BP Location: Right Arm)   Pulse 61   Resp 17   SpO2 100%   Physical Exam Vitals and nursing note reviewed.  Constitutional:      Appearance: He is well-developed. He is not ill-appearing.  HENT:     Head: Normocephalic and atraumatic.     Nose: Nose normal.     Mouth/Throat:     Mouth: Mucous membranes are moist.   Eyes:     Pupils: Pupils are equal, round, and reactive to light.  Cardiovascular:     Rate and Rhythm: Normal rate and regular rhythm.     Heart sounds: Normal heart sounds. No murmur heard. Pulmonary:     Effort: Pulmonary effort is normal. No respiratory distress.     Breath sounds: Normal breath sounds. No wheezing.  Abdominal:     Palpations: Abdomen is soft.     Tenderness: There is no abdominal tenderness. There is no rebound.  Musculoskeletal:     Cervical back: Neck supple.     Right lower leg: No edema.     Left lower leg: No edema.  Lymphadenopathy:     Cervical: No cervical adenopathy.  Skin:    General: Skin is warm and dry.  Neurological:     Mental Status: He is alert and oriented to person, place, and time.  Psychiatric:        Mood and Affect: Mood normal.    ED Results / Procedures / Treatments   Labs (all labs ordered are listed, but only abnormal results are displayed) Labs Reviewed  URINALYSIS, ROUTINE W REFLEX MICROSCOPIC - Abnormal; Notable for the following components:      Result Value   Color, Urine STRAW (*)    Glucose, UA >=500 (*)    Ketones, ur 5 (*)    Leukocytes,Ua TRACE (*)    All other components within normal limits  COMPREHENSIVE METABOLIC PANEL - Abnormal; Notable for the following components:   Sodium 133 (*)    Chloride 97 (*)    Glucose, Bld 246 (*)    All other components within normal limits  CBG MONITORING, ED - Abnormal; Notable for the following components:   Glucose-Capillary 251 (*)    All other components within normal limits  CBC WITH DIFFERENTIAL/PLATELET    EKG EKG Interpretation  Date/Time:  Saturday December 10 2020 00:39:00 EST Ventricular Rate:  64 PR Interval:  207 QRS Duration: 102 QT Interval:  408 QTC Calculation: 421 R Axis:   -2 Text Interpretation: Sinus rhythm No significant change since last tracing Confirmed by Ross Marcus ([id number]) on 12/10/2020 12:51:50 AM  Radiology No results  found.  Procedures Procedures   Medications Ordered in ED Medications - No data to display  ED Course  I have reviewed the triage vital signs and the nursing notes.  Pertinent labs & imaging results that were available during my care of the patient were reviewed by me and considered in my medical decision making (see chart for details).    MDM Rules/Calculators/A&P  Patient presents with concerns for elevated blood sugars.  He also has had a persistent epigastric discomfort.  He is nontoxic and vital signs are reassuring.  His physical exam is fairly benign.  CBC is normal.  CMP shows a glucose of 246.  No anion gap.  No evidence of DKA.  Urinalysis is not consistent with a UTI.  I did obtain a screening EKG which is unchanged from prior.  No acute arrhythmic or ischemic changes.  He has appropriate follow-up and it appears that he is being worked up as an outpatient by cardiology.  I suspect he likely needs some adjustments in his diabetes medication.  He has maxed out on metformin.  We discussed that it is best if his primary physician add additional agents and/or insulin.  In the meantime, recommend dietary modifications.  Regarding his ongoing epigastric complaints, these have been ongoing for some time since his H. pylori infection.  He is nontender in the abdomen.  Was started on a PPI and Zofran and recommend following up with gastroenterology as an outpatient.  Patient stated understanding.  After history, exam, and medical workup I feel the patient has been appropriately medically screened and is safe for discharge home. Pertinent diagnoses were discussed with the patient. Patient was given return precautions.  Final Clinical Impression(s) / ED Diagnoses Final diagnoses:  Hyperglycemia  Epigastric discomfort    Rx / DC Orders ED Discharge Orders          Ordered    ondansetron (ZOFRAN ODT) 4 MG disintegrating tablet  Every 8 hours PRN        12/10/20  0055    omeprazole (PRILOSEC) 20 MG capsule  Daily        12/10/20 0055             Shon Baton, MD 12/10/20 [phone]

## 2020-12-10 NOTE — Discharge Instructions (Signed)
You were seen today with concerns for high blood sugars.  Your blood sugar here is around 300.  You are on your max dose of metformin.  You likely need a second medication.  This is most appropriately started by your primary physician.  Call on Monday to follow-up.  In the meantime, reduce sugar intake in your diet and drink plenty of water.  Avoid sugary drinks.  Start Zofran and omeprazole for your ongoing epigastric discomfort.  Follow-up with gastroenterology.  Also follow-up with cardiology as planned for echocardiogram and coronary artery CT.

## 2020-12-12 ENCOUNTER — Ambulatory Visit: Payer: Self-pay | Admitting: Gastroenterology

## 2020-12-21 ENCOUNTER — Telehealth (HOSPITAL_COMMUNITY): Payer: Self-pay | Admitting: *Deleted

## 2020-12-21 NOTE — Telephone Encounter (Signed)
Reaching out to patient to offer assistance regarding upcoming cardiac imaging study; pt verbalizes understanding of appt date/time, parking situation and where to check in, pre-test NPO status and medications ordered, and verified current allergies; name and call back number provided for further questions should they arise ° °Abishai Viegas RN Navigator Cardiac Imaging °[facility] °[phone] office °[phone] cell  ° °Patient to take 100mg metoprolol tartrate two hours prior to cardiac CT scan. °

## 2020-12-22 ENCOUNTER — Other Ambulatory Visit: Payer: Self-pay

## 2020-12-22 ENCOUNTER — Ambulatory Visit
Admission: RE | Admit: 2020-12-22 | Discharge: 2020-12-22 | Disposition: A | Discharge status: Home / Self Care | Payer: 59 | Source: Ambulatory Visit | Attending: Cardiology | Admitting: Cardiology

## 2020-12-22 DIAGNOSIS — R072 Precordial pain: Secondary | ICD-10-CM | POA: Diagnosis present

## 2020-12-22 MED ORDER — NITROGLYCERIN 0.4 MG SL SUBL
0.8000 mg | SUBLINGUAL_TABLET | Freq: Once | SUBLINGUAL | Status: AC
  Administered 2020-12-22: 0.8 mg via SUBLINGUAL

## 2020-12-22 MED ORDER — IOHEXOL 350 MG/ML SOLN
75.0000 mL | Freq: Once | INTRAVENOUS | Status: AC | PRN
  Administered 2020-12-22: 75 mL via INTRAVENOUS

## 2020-12-22 NOTE — Progress Notes (Signed)
Patient tolerated CT well. Drank water after. Vital signs stable encourage to drink water throughout day.Reasons explained and verbalized understanding. Ambulated steady gait.  

## 2020-12-23 ENCOUNTER — Telehealth: Payer: Self-pay

## 2020-12-23 DIAGNOSIS — Z13220 Encounter for screening for lipoid disorders: Secondary | ICD-10-CM

## 2020-12-23 MED ORDER — ASPIRIN EC 81 MG PO TBEC: 81.0000 mg | DELAYED_RELEASE_TABLET | Freq: Every day | ORAL | 3 refills | Status: AC

## 2020-12-23 NOTE — Telephone Encounter (Signed)
-----   Message from Debbe Odea, MD sent at 12/22/2020  6:26 PM EST ----- Mild nonobstructive coronary artery disease.  Start aspirin 81 mg daily.  Obtain fasting lipid profile.  Keep follow-up appointment.  Will recommend statin based on lipid panel results.

## 2020-12-23 NOTE — Telephone Encounter (Signed)
Called patient using Interpreter [phone]. Requested that the patient call us back on his VM.   Lab order has been entered and just needs to be scheduled.

## 2020-12-27 ENCOUNTER — Other Ambulatory Visit (INDEPENDENT_AMBULATORY_CARE_PROVIDER_SITE_OTHER): Payer: 59

## 2020-12-27 ENCOUNTER — Other Ambulatory Visit: Payer: Self-pay

## 2020-12-27 DIAGNOSIS — Z13220 Encounter for screening for lipoid disorders: Secondary | ICD-10-CM

## 2020-12-27 NOTE — Telephone Encounter (Signed)
Patient given result note while in office for a lab draw.He verbalized understanding and agreed with plan.

## 2020-12-28 LAB — LIPID PANEL
Chol/HDL Ratio: 3.6 ratio (ref 0.0–5.0)
Cholesterol, Total: 187 mg/dL (ref 100–199)
HDL: 52 mg/dL (ref >39)
LDL Chol Calc (NIH): 121 mg/dL — ABNORMAL HIGH (ref 0–99)
Triglycerides: 73 mg/dL (ref 0–149)
VLDL Cholesterol Cal: 14 mg/dL (ref 5–40)

## 2021-01-03 ENCOUNTER — Telehealth: Payer: Self-pay

## 2021-01-03 MED ORDER — ATORVASTATIN CALCIUM 40 MG PO TABS: 40.0000 mg | ORAL_TABLET | Freq: Every day | ORAL | 5 refills | Status: DC

## 2021-01-03 NOTE — Telephone Encounter (Signed)
-----   Message from Debbe Odea, MD sent at 12/29/2020  5:30 PM EST ----- LDL elevated.  Start Lipitor 40 mg daily.

## 2021-01-03 NOTE — Telephone Encounter (Signed)
Called patient using [phone] and informed him of the result note as documented below. Patient verbalized understanding and agreed with plan.

## 2021-01-13 ENCOUNTER — Ambulatory Visit (INDEPENDENT_AMBULATORY_CARE_PROVIDER_SITE_OTHER): Payer: 59

## 2021-01-13 ENCOUNTER — Other Ambulatory Visit: Payer: Self-pay

## 2021-01-13 DIAGNOSIS — R072 Precordial pain: Secondary | ICD-10-CM

## 2021-01-13 LAB — ECHOCARDIOGRAM COMPLETE
AR max vel: 2.45 cm2
AV Area VTI: 2.74 cm2
AV Area mean vel: 2.47 cm2
AV Mean grad: 6 mmHg
AV Peak grad: 11.3 mmHg
Ao pk vel: 1.68 m/s
Area-P 1/2: 4.57 cm2
P 1/2 time: 518 msec
S' Lateral: 3.6 cm
Single Plane A4C EF: 53.7 %

## 2021-01-26 ENCOUNTER — Encounter: Payer: Self-pay | Admitting: Certified Registered Nurse Anesthetist

## 2021-01-27 ENCOUNTER — Other Ambulatory Visit: Payer: Self-pay

## 2021-01-27 ENCOUNTER — Encounter: Payer: Self-pay | Admitting: Gastroenterology

## 2021-01-27 ENCOUNTER — Ambulatory Visit (AMBULATORY_SURGERY_CENTER): Payer: 59 | Admitting: Gastroenterology

## 2021-01-27 VITALS — BP 108/63 | HR 58 | Temp 97.8°F | Resp 15 | Ht 66.0 in | Wt 184.0 lb

## 2021-01-27 DIAGNOSIS — K2970 Gastritis, unspecified, without bleeding: Secondary | ICD-10-CM | POA: Diagnosis not present

## 2021-01-27 DIAGNOSIS — R140 Abdominal distension (gaseous): Secondary | ICD-10-CM

## 2021-01-27 DIAGNOSIS — K2950 Unspecified chronic gastritis without bleeding: Secondary | ICD-10-CM | POA: Diagnosis not present

## 2021-01-27 DIAGNOSIS — Z1211 Encounter for screening for malignant neoplasm of colon: Secondary | ICD-10-CM

## 2021-01-27 MED ORDER — SODIUM CHLORIDE 0.9 % IV SOLN: 500.0000 mL | Freq: Once | INTRAVENOUS | Status: DC

## 2021-01-27 NOTE — Progress Notes (Signed)
Report given to PACU, vss 

## 2021-01-27 NOTE — Patient Instructions (Signed)
Please read handouts provided. Continue present medications. Await pathology results. Repeat colonoscopy in 10 years for screening. Resume previous diet.   YOU HAD AN ENDOSCOPIC PROCEDURE TODAY AT THE [facility]:   Refer to the procedure report that was given to you for any specific questions about what was found during the examination.  If the procedure report does not answer your questions, please call your gastroenterologist to clarify.  If you requested that your care partner not be given the details of your procedure findings, then the procedure report has been included in a sealed envelope for you to review at your convenience later.  YOU SHOULD EXPECT: Some feelings of bloating in the abdomen. Passage of more gas than usual.  Walking can help get rid of the air that was put into your GI tract during the procedure and reduce the bloating. If you had a lower endoscopy (such as a colonoscopy or flexible sigmoidoscopy) you may notice spotting of blood in your stool or on the toilet paper. If you underwent a bowel prep for your procedure, you may not have a normal bowel movement for a few days.  Please Note:  You might notice some irritation and congestion in your nose or some drainage.  This is from the oxygen used during your procedure.  There is no need for concern and it should clear up in a day or so.  SYMPTOMS TO REPORT IMMEDIATELY:  USTED TUVO UN PROCEDIMIENTO ENDOSCPICO HOY EN EL [facility]:   Lea el informe del procedimiento que se le entreg para cualquier pregunta especfica sobre lo que se Dentist.  Si el informe del examen no responde a sus preguntas, por favor llame a su gastroenterlogo para aclararlo.  Si usted solicit que no se le den [employer] de lo que se Clinical cytogeneticist en su procedimiento al [employer] va a cuidar, entonces el informe del procedimiento se ha incluido en un sobre sellado para que usted lo revise despus cuando le  sea ms conveniente.   LO QUE PUEDE ESPERAR: Algunas sensaciones de hinchazn en el abdomen.  Puede tener ms gases de lo normal.  El caminar puede ayudarle a eliminar el aire que se le puso en el tracto gastrointestinal durante el procedimiento y reducir la hinchazn.  Si le hicieron una endoscopia inferior (como una colonoscopia o una sigmoidoscopia flexible), podra notar manchas de sangre en las heces fecales o en el papel higinico.  Si se someti a una preparacin intestinal para su procedimiento, es posible que no tenga una evacuacin intestinal normal durante [employer].   Tenga en cuenta:  Es posible que note un poco de irritacin y congestin en la nariz o algn drenaje.  Esto es debido al oxgeno [employer] durante su procedimiento.  No hay que preocuparse y esto debe desaparecer ms o Regulatory affairs officer.   SNTOMAS PARA REPORTAR INMEDIATAMENTE:    Despus de la endoscopia superior (EGD)  Vmitos de Retail buyer o material como caf molido   Dolor en el pecho o dolor debajo de los omplatos que antes no tena   Dolor o dificultad persistente para tragar  Falta de aire que antes no tena   Fiebre de 100F o ms  Heces fecales negras y pegajosas   Para asuntos urgentes o de Associate Professor, puede comunicarse con un gastroenterlogo a cualquier hora llamando al [phone].  DIETA:  Recomendamos una comida pequea al principio, pero luego puede continuar con su dieta normal.  Tome muchos  lquidos, pero debe evitar las bebidas alcohlicas durante 24 horas.    ACTIVIDAD:  Debe planear tomarse las cosas con calma por el resto del da y no debe CONDUCIR ni usar maquinaria pesada Patent examiner (debido a los medicamentos de sedacin utilizados durante el examen).     SEGUIMIENTO: Nuestro personal llamar al nmero que aparece en su historial al siguiente da hbil de su procedimiento para ver cmo se siente y para responder cualquier pregunta o inquietud que pueda tener con respecto a la informacin  que se le dio despus del procedimiento. Si no podemos contactarle, le dejaremos un mensaje.  Sin embargo, si se siente bien y no tiene English as a second language teacher, no es necesario que nos devuelva la llamada.  Asumiremos que ha regresado a sus actividades diarias normales sin incidentes. Si se le tomaron algunas biopsias, le contactaremos por telfono o por carta en las prximas 3 semanas.  Si no ha sabido [employer] biopsias en el transcurso de 3 semanas, por favor llmenos al [phone].   FIRMAS/CONFIDENCIALIDAD: Usted y/o el acompaante que le cuide han firmado documentos que se ingresarn en su historial mdico electrnico.  Estas firmas atestiguan el hecho de que la informacin anterior    Following upper endoscopy (EGD)  Vomiting of blood or coffee ground material  New chest pain or pain under the shoulder blades  Painful or persistently difficult swallowing  New shortness of breath  Fever of 100F or higher  Black, tarry-looking stools  For urgent or emergent issues, a gastroenterologist can be reached at any hour by calling [phone]. Do not use MyChart messaging for urgent concerns.    DIET:  We do recommend a small meal at first, but then you may proceed to your regular diet.  Drink plenty of fluids but you should avoid alcoholic beverages for 24 hours.  ACTIVITY:  You should plan to take it easy for the rest of today and you should NOT DRIVE or use heavy machinery until tomorrow (because of the sedation medicines used during the test).    FOLLOW UP: Our staff will call the number listed on your records 48-72 hours following your procedure to check on you and address any questions or concerns that you may have regarding the information given to you following your procedure. If we do not reach you, we will leave a message.  We will attempt to reach you two times.  During this call, we will ask if you have developed any symptoms of COVID 19. If you develop any symptoms (ie: fever,  flu-like symptoms, shortness of breath, cough etc.) before then, please call [phone].  If you test positive for Covid 19 in the 2 weeks post procedure, please call and report this information to [place].    If any biopsies were taken you will be contacted by phone or by letter within the next 1-3 weeks.  Please call us at [phone] if you have not heard about the biopsies in 3 weeks.    SIGNATURES/CONFIDENTIALITY: You and/or your care partner have signed paperwork which will be entered into your electronic medical record.  These signatures attest to the fact that that the information above on your After Visit Summary has been reviewed and is understood.  Full responsibility of the confidentiality of this discharge information lies with you and/or your care-partner.

## 2021-01-27 NOTE — Progress Notes (Signed)
Called to room to assist during endoscopic procedure.  Patient ID and intended procedure confirmed with present staff. Received instructions for my participation in the procedure from the performing physician.  

## 2021-01-27 NOTE — Progress Notes (Signed)
[facility] History and Physical   Primary Care Physician:  Pcp, No   Reason for Procedure:   Colon cancer screening/Bloating  Plan:    Screening colonoscopy, diagnostic EGD     HPI: Frederick Clarke is a 66 y.o. male undergoing initial average risk screening colonoscopy.  He has no family history of colon cancer.  He has chronic bloating and abdominal discomfort which has improved in the past month or so.  He has a history of H. Pylori which has been treated.   Past Medical History:  Diagnosis Date   BPH (benign prostatic hyperplasia)    GERD (gastroesophageal reflux disease)    Gout    H. pylori infection    Hyperlipidemia    Hypertension    Type 2 diabetes mellitus (HCC)     Past Surgical History:  Procedure Laterality Date   NO PAST SURGERIES      Prior to Admission medications   Medication Sig Start Date End Date Taking? Authorizing Provider  aspirin EC 81 MG tablet Take 1 tablet (81 mg total) by mouth daily. Swallow whole. 12/23/20  Yes Agbor-Etang, Arlys John, MD  Calcium Acetate, Phos Binder, (CALCIUM ACETATE PO) Take by mouth.   Yes [provider]  Cyanocobalamin (VITAMIN B 12 PO) Take by mouth.   Yes [provider]  JARDIANCE 25 MG TABS tablet Take 25 mg by mouth daily. 12/20/20  Yes [provider]  MAGNESIUM PO Take by mouth.   Yes [provider]  metformin (FORTAMET) 500 MG (OSM) 24 hr tablet Take 1,000 mg by mouth 2 (two) times daily. 01/11/21  Yes [provider]  Omega-3 Fatty Acids (OMEGA 3 PO) Take by mouth.   Yes [provider]  polyethylene glycol (MIRALAX / GLYCOLAX) 17 g packet Take 17 g by mouth daily as needed.   Yes [provider]  POTASSIUM PO Take by mouth.   Yes [provider]  tamsulosin (FLOMAX) 0.4 MG CAPS capsule Take 0.4 mg by mouth daily. 10/18/20  Yes [provider]  Turmeric (QC TUMERIC COMPLEX PO) Take by mouth.   Yes [provider]   atorvastatin (LIPITOR) 40 MG tablet Take 1 tablet (40 mg total) by mouth daily. Patient not taking: Reported on 01/27/2021 01/03/21 04/03/21  Debbe Odea, MD  metoprolol tartrate (LOPRESSOR) 100 MG tablet Take 1 tablet (100 mg total) by mouth once for 1 dose. Take 2 hours prior to your CT scan. 12/08/20 12/08/20  Debbe Odea, MD  omeprazole (PRILOSEC) 20 MG capsule Take 1 capsule (20 mg total) by mouth daily. Patient not taking: Reported on 01/27/2021 12/10/20   Horton, Mayer Masker, MD  ondansetron (ZOFRAN ODT) 4 MG disintegrating tablet Take 1 tablet (4 mg total) by mouth every 8 (eight) hours as needed for nausea or vomiting. Patient not taking: Reported on 01/27/2021 12/10/20   Shon Baton, MD    Current Outpatient Medications  Medication Sig Dispense Refill   aspirin EC 81 MG tablet Take 1 tablet (81 mg total) by mouth daily. Swallow whole. 90 tablet 3   Calcium Acetate, Phos Binder, (CALCIUM ACETATE PO) Take by mouth.     Cyanocobalamin (VITAMIN B 12 PO) Take by mouth.     JARDIANCE 25 MG TABS tablet Take 25 mg by mouth daily.     MAGNESIUM PO Take by mouth.     metformin (FORTAMET) 500 MG (OSM) 24 hr tablet Take 1,000 mg by mouth 2 (two) times daily.     Omega-3 Fatty  Acids (OMEGA 3 PO) Take by mouth.     polyethylene glycol (MIRALAX / GLYCOLAX) 17 g packet Take 17 g by mouth daily as needed.     POTASSIUM PO Take by mouth.     tamsulosin (FLOMAX) 0.4 MG CAPS capsule Take 0.4 mg by mouth daily.     Turmeric (QC TUMERIC COMPLEX PO) Take by mouth.     atorvastatin (LIPITOR) 40 MG tablet Take 1 tablet (40 mg total) by mouth daily. (Patient not taking: Reported on 01/27/2021) 30 tablet 5   metoprolol tartrate (LOPRESSOR) 100 MG tablet Take 1 tablet (100 mg total) by mouth once for 1 dose. Take 2 hours prior to your CT scan. 1 tablet 0   omeprazole (PRILOSEC) 20 MG capsule Take 1 capsule (20 mg total) by mouth daily. (Patient not taking: Reported on 01/27/2021) 30 capsule 0    ondansetron (ZOFRAN ODT) 4 MG disintegrating tablet Take 1 tablet (4 mg total) by mouth every 8 (eight) hours as needed for nausea or vomiting. (Patient not taking: Reported on 01/27/2021) 20 tablet 0   Current Facility-Administered Medications  Medication Dose Route Frequency Provider Last Rate Last Admin   0.9 %  sodium chloride infusion  500 mL Intravenous Once Jenel Lucksunningham, Phoenyx Paulsen E, MD        Allergies as of 01/27/2021 - Review Complete 01/27/2021  Allergen Reaction Noted   Pineapple Itching 12/05/2020    Family History  Problem Relation Age of Onset   Diabetes Mother    Hypertension Mother    Stomach cancer Father    Liver cancer Father    Lung cancer Brother    Heart disease Daughter    Colon cancer Neg Hx    Rectal cancer Neg Hx    Esophageal cancer Neg Hx     Social History   Socioeconomic History   Marital status: Divorced    Spouse name: Not on file   Number of children: 6   Years of education: Not on file   Highest education level: Not on file  Occupational History   Occupation: Engineer, civil (consulting)carpet  Tobacco Use   Smoking status: Former    Types: Cigarettes    Quit date: 1993    Years since quitting: 30.0   Smokeless tobacco: Never  Vaping Use   Vaping Use: Never used  Substance and Sexual Activity   Alcohol use: Yes    Comment: rare   Drug use: Never   Sexual activity: Not on file  Other Topics Concern   Not on file  Social History Narrative   Not on file   Social Determinants of Health   Financial Resource Strain: Not on file  Food Insecurity: Not on file  Transportation Needs: Not on file  Physical Activity: Not on file  Stress: Not on file  Social Connections: Not on file  Intimate Partner Violence: Not on file    Review of Systems:  All other review of systems negative except as mentioned in the HPI.  Physical Exam: Vital signs BP 136/75    Pulse 60    Temp 97.8 F (36.6 C)    Ht 5\' 6"  (1.676 m)    Wt 184 lb (83.5 kg)    SpO2 99%    BMI 29.70 kg/m    General:   Alert,  Well-developed, well-nourished, pleasant and cooperative in NAD Airway:  Mallampati 1 Lungs:  Clear throughout to auscultation.   Heart:  Regular rate and rhythm; no murmurs, clicks, rubs,  or gallops. Abdomen:  Soft, nontender and nondistended. Normal bowel sounds.   Neuro/Psych:  Normal mood and affect. A and O x 3   Priscella Donna E. Tomasa Rand, MD [facility]

## 2021-01-27 NOTE — Progress Notes (Signed)
1439 Robinul 0.1 mg IV given due large amount of secretions upon assessment.  MD made aware, vss 

## 2021-01-27 NOTE — Op Note (Signed)
[facility] Patient Name: Frederick Clarke Procedure Date: 01/27/2021 1:27 PM MRN: [id number] Endoscopist: Lorin Picket E. Tomasa Rand , MD Age: 66 Referring MD:  Date of Birth: [birth year] Gender: Male Account #: [id number] Procedure:                Colonoscopy Indications:              Screening for colorectal malignant neoplasm, This                            is the patient's first colonoscopy Medicines:                Monitored Anesthesia Care Procedure:                Pre-Anesthesia Assessment:                           - Prior to the procedure, a History and Physical                            was performed, and patient medications and                            allergies were reviewed. The patient's tolerance of                            previous anesthesia was also reviewed. The risks                            and benefits of the procedure and the sedation                            options and risks were discussed with the patient.                            All questions were answered, and informed consent                            was obtained. Prior Anticoagulants: The patient has                            taken no previous anticoagulant or antiplatelet                            agents. ASA Grade Assessment: II - A patient with                            mild systemic disease. After reviewing the risks                            and benefits, the patient was deemed in                            satisfactory condition to undergo the procedure.  After obtaining informed consent, the colonoscope                            was passed under direct vision. Throughout the                            procedure, the patient's blood pressure, pulse, and                            oxygen saturations were monitored continuously. The                            CF [id number] #[id number] was introduced through the anus                            and advanced to the  the terminal ileum, with                            identification of the appendiceal orifice and IC                            valve. The colonoscopy was performed without                            difficulty. The patient tolerated the procedure                            well. The quality of the bowel preparation was                            adequate. The terminal ileum, the appendiceal                            orifice and the rectum were photographed. Scope In: 3:05:53 PM Scope Out: 3:18:14 PM Scope Withdrawal Time: 0 hours 9 minutes 12 seconds  Total Procedure Duration: 0 hours 12 minutes 21 seconds  Findings:                 The perianal and digital rectal examinations were                            normal. Pertinent negatives include normal                            sphincter tone and no palpable rectal lesions.                           The colon (entire examined portion) appeared normal.                           The terminal ileum appeared normal.                           Non-bleeding internal hemorrhoids were found during  retroflexion. The hemorrhoids were Grade I                            (internal hemorrhoids that do not prolapse).                           No additional abnormalities were found on                            retroflexion. Complications:            No immediate complications. Estimated Blood Loss:     Estimated blood loss: none. Impression:               - The entire examined colon is normal.                           - The examined portion of the ileum was normal.                           - Non-bleeding internal hemorrhoids.                           - No specimens collected. Recommendation:           - Patient has a contact number available for                            emergencies. The signs and symptoms of potential                            delayed complications were discussed with the                             patient. Return to normal activities tomorrow.                            Written discharge instructions were provided to the                            patient.                           - Resume previous diet.                           - Continue present medications.                           - Repeat colonoscopy in 10 years for screening                            purposes. Blue Ruggerio E. Tomasa Rand, MD 01/27/2021 3:27:53 PM This report has been signed electronically.

## 2021-01-27 NOTE — Progress Notes (Signed)
1500  Pt experienced laryngeal spasm with jaw thrust performed. vss  

## 2021-01-27 NOTE — Op Note (Signed)
[facility] Patient Name: Frederick Clarke Procedure Date: 01/27/2021 1:26 PM MRN: [id number] Endoscopist: Frederick Picket E. Tomasa Rand , MD Age: 66 Referring MD:  Date of Birth: 1955/11/03 Gender: Male Account #: [id number] Procedure:                Upper GI endoscopy Indications:              Abdominal bloating Medicines:                Monitored Anesthesia Care Procedure:                Pre-Anesthesia Assessment:                           - Prior to the procedure, a History and Physical                            was performed, and patient medications and                            allergies were reviewed. The patient's tolerance of                            previous anesthesia was also reviewed. The risks                            and benefits of the procedure and the sedation                            options and risks were discussed with the patient.                            All questions were answered, and informed consent                            was obtained. Prior Anticoagulants: The patient has                            taken no previous anticoagulant or antiplatelet                            agents. ASA Grade Assessment: II - A patient with                            mild systemic disease. After reviewing the risks                            and benefits, the patient was deemed in                            satisfactory condition to undergo the procedure.                           After obtaining informed consent, the endoscope was  passed under direct vision. Throughout the                            procedure, the patient's blood pressure, pulse, and                            oxygen saturations were monitored continuously. The                            GIF [id number] was introduced through the                            mouth, and advanced to the second part of duodenum.                            The upper GI endoscopy was  accomplished without                            difficulty. The patient tolerated the procedure                            well. Scope In: Scope Out: Findings:                 The examined portions of the nasopharynx,                            oropharynx and larynx were normal.                           The examined esophagus was normal.                           Diffuse moderately erythematous mucosa without                            bleeding was found in the gastric body and in the                            gastric antrum. Biopsies were taken with a cold                            forceps for Helicobacter pylori testing. Estimated                            blood loss was minimal.                           The examined duodenum was normal. Complications:            No immediate complications. Estimated Blood Loss:     Estimated blood loss was minimal. Impression:               - The examined portions of the nasopharynx,  oropharynx and larynx were normal.                           - Normal esophagus.                           - Erythematous mucosa in the gastric body and                            antrum. Biopsied.                           - Normal examined duodenum. Recommendation:           - Patient has a contact number available for                            emergencies. The signs and symptoms of potential                            delayed complications were discussed with the                            patient. Return to normal activities tomorrow.                            Written discharge instructions were provided to the                            patient.                           - Resume previous diet.                           - Continue present medications.                           - Await pathology results.                           - Further recommendations pending biopsy results. [name], MD 01/27/2021 3:24:22 PM This  report has been signed electronically.

## 2021-01-27 NOTE — Progress Notes (Signed)
Interpreter Frederick Clarke In attendance during check-in in admitting.

## 2021-01-27 NOTE — Progress Notes (Signed)
[facility] History and Physical   Primary Care Physician:  Pcp, No   Reason for Procedure:   Colon cancer screening/bloating  Plan:    Screening colonoscopy, diagnostic EGD     HPI: Frederick Clarke is a 66 y.o. male undergoing initial average risk screening colonoscopy.  He has no family history of colon cancer.  He has chronic bloating and abdominal discomfort.  He was treated for H. Pylori and reportedly confirmed eradication (documentation not available for review).    Past Medical History:  Diagnosis Date   BPH (benign prostatic hyperplasia)    GERD (gastroesophageal reflux disease)    Gout    H. pylori infection    Hyperlipidemia    Type 2 diabetes mellitus (HCC)     Past Surgical History:  Procedure Laterality Date   NO PAST SURGERIES      Prior to Admission medications   Medication Sig Start Date End Date Taking? Authorizing Provider  aspirin EC 81 MG tablet Take 1 tablet (81 mg total) by mouth daily. Swallow whole. 12/23/20   Debbe Odea, MD  atorvastatin (LIPITOR) 40 MG tablet Take 1 tablet (40 mg total) by mouth daily. 01/03/21 04/03/21  Debbe Odea, MD  metFORMIN (GLUCOPHAGE) 500 MG tablet Take 500 mg by mouth 2 (two) times daily with a meal.    [provider]  metoprolol tartrate (LOPRESSOR) 100 MG tablet Take 1 tablet (100 mg total) by mouth once for 1 dose. Take 2 hours prior to your CT scan. 12/08/20 12/08/20  Debbe Odea, MD  omeprazole (PRILOSEC) 20 MG capsule Take 1 capsule (20 mg total) by mouth daily. 12/10/20   Horton, Mayer Masker, MD  ondansetron (ZOFRAN ODT) 4 MG disintegrating tablet Take 1 tablet (4 mg total) by mouth every 8 (eight) hours as needed for nausea or vomiting. 12/10/20   Horton, Mayer Masker, MD  polyethylene glycol (MIRALAX / GLYCOLAX) 17 g packet Take 17 g by mouth daily as needed.    [provider]  tamsulosin (FLOMAX) 0.4 MG CAPS capsule Take 0.4 mg by mouth daily. Patient not  taking: Reported on 12/08/2020 10/18/20   [provider]    Current Outpatient Medications  Medication Sig Dispense Refill   aspirin EC 81 MG tablet Take 1 tablet (81 mg total) by mouth daily. Swallow whole. 90 tablet 3   atorvastatin (LIPITOR) 40 MG tablet Take 1 tablet (40 mg total) by mouth daily. 30 tablet 5   metFORMIN (GLUCOPHAGE) 500 MG tablet Take 500 mg by mouth 2 (two) times daily with a meal.     metoprolol tartrate (LOPRESSOR) 100 MG tablet Take 1 tablet (100 mg total) by mouth once for 1 dose. Take 2 hours prior to your CT scan. 1 tablet 0   omeprazole (PRILOSEC) 20 MG capsule Take 1 capsule (20 mg total) by mouth daily. 30 capsule 0   ondansetron (ZOFRAN ODT) 4 MG disintegrating tablet Take 1 tablet (4 mg total) by mouth every 8 (eight) hours as needed for nausea or vomiting. 20 tablet 0   polyethylene glycol (MIRALAX / GLYCOLAX) 17 g packet Take 17 g by mouth daily as needed.     tamsulosin (FLOMAX) 0.4 MG CAPS capsule Take 0.4 mg by mouth daily. (Patient not taking: Reported on 12/08/2020)     No current facility-administered medications for this visit.    Allergies as of 01/27/2021 - Review Complete 01/27/2021  Allergen Reaction Noted   Pineapple Itching 12/05/2020    Family History  Problem Relation Age  of Onset   Diabetes Mother    Hypertension Mother    Liver cancer Father    Lung cancer Brother    Heart disease Daughter     Social History   Socioeconomic History   Marital status: Divorced    Spouse name: Not on file   Number of children: 6   Years of education: Not on file   Highest education level: Not on file  Occupational History   Occupation: carpet  Tobacco Use   Smoking status: Former    Types: Cigarettes    Quit date: 1993    Years since quitting: 30.0   Smokeless tobacco: Never  Vaping Use   Vaping Use: Never used  Substance and Sexual Activity   Alcohol use: Yes    Comment: occasional   Drug use: Not on file   Sexual  activity: Not on file  Other Topics Concern   Not on file  Social History Narrative   Not on file   Social Determinants of Health   Financial Resource Strain: Not on file  Food Insecurity: Not on file  Transportation Needs: Not on file  Physical Activity: Not on file  Stress: Not on file  Social Connections: Not on file  Intimate Partner Violence: Not on file    Review of Systems:  All other review of systems negative except as mentioned in the HPI.  Physical Exam: Vital signs There were no vitals taken for this visit.  General:   Alert,  Well-developed, well-nourished, pleasant and cooperative in NAD Airway:  Mallampati 1 Lungs:  Clear throughout to auscultation.   Heart:  Regular rate and rhythm; no murmurs, clicks, rubs,  or gallops. Abdomen:  Soft, nontender and nondistended. Normal bowel sounds.   Neuro/Psych:  Normal mood and affect. A and O x 3   Frederick Clarke E. Tomasa Rand, MD [facility]

## 2021-01-31 ENCOUNTER — Ambulatory Visit (INDEPENDENT_AMBULATORY_CARE_PROVIDER_SITE_OTHER): Payer: 59 | Admitting: Cardiology

## 2021-01-31 ENCOUNTER — Encounter: Payer: Self-pay | Admitting: Cardiology

## 2021-01-31 ENCOUNTER — Other Ambulatory Visit: Payer: Self-pay

## 2021-01-31 ENCOUNTER — Telehealth: Payer: Self-pay

## 2021-01-31 VITALS — BP 120/64 | HR 88 | Ht 66.0 in | Wt 187.2 lb

## 2021-01-31 DIAGNOSIS — E785 Hyperlipidemia, unspecified: Secondary | ICD-10-CM | POA: Diagnosis not present

## 2021-01-31 DIAGNOSIS — I2510 Atherosclerotic heart disease of native coronary artery without angina pectoris: Secondary | ICD-10-CM | POA: Diagnosis not present

## 2021-01-31 MED ORDER — ATORVASTATIN CALCIUM 40 MG PO TABS: 40.0000 mg | ORAL_TABLET | Freq: Every day | ORAL | 5 refills | Status: DC

## 2021-01-31 NOTE — Telephone Encounter (Signed)
Left message on follow up call. 

## 2021-01-31 NOTE — Patient Instructions (Signed)
Medication Instructions:  Your physician recommends that you continue on your current medications as directed. Please refer to the Current Medication list given to you today.  A refill for Atorvastatin has been sent to your pharmacy.  *If you need a refill on your cardiac medications before your next appointment, please call your pharmacy*   Lab Work: Your physician recommends that you return for a FASTING lipid profile: in 6 months  If you have labs (blood work) drawn today and your tests are completely normal, you will receive your results only by: [place] (if you have MyChart) OR A paper copy in the mail If you have any lab test that is abnormal or we need to change your treatment, we will call you to review the results.   Testing/Procedures: None ordered   Follow-Up: At [facility], you and your health needs are our priority.  As part of our continuing mission to provide you with exceptional heart care, we have created designated Provider Care Teams.  These Care Teams include your primary Cardiologist (physician) and Advanced Practice Providers (APPs -  Physician Assistants and Nurse Practitioners) who all work together to provide you with the care you need, when you need it.  We recommend signing up for the patient portal called "MyChart".  Sign up information is provided on this After Visit Summary.  MyChart is used to connect with patients for Virtual Visits (Telemedicine).  Patients are able to view lab/test results, encounter notes, upcoming appointments, etc.  Non-urgent messages can be sent to your provider as well.   To learn more about what you can do with MyChart, go to [email or web address].    Your next appointment:   Your physician wants you to follow-up in: 6 months You will receive a reminder letter in the mail two months in advance. If you don't receive a letter, please call our office to schedule the follow-up appointment.   The format for your next  appointment:   In Person  Provider:   You may see Kate Sable, MD or one of the following Advanced Practice Providers on your designated Care Team:   Murray Hodgkins, NP Christell Faith, PA-C Cadence Kathlen Mody, PA-C{    Other Instructions N/A

## 2021-01-31 NOTE — Telephone Encounter (Signed)
Second attempt follow up call to pt, no answer.  

## 2021-01-31 NOTE — Progress Notes (Signed)
Cardiology Office Note:    Date:  01/31/2021   ID:  Frederick Clarke, DOB 31-Dec-1955, MRN [id number]  PCP:  Pcp, No   [facility] Providers Cardiologist:  None     Referring MD: No ref. provider found   Chief Complaint  Patient presents with   Other    Post testing no complaints today. Meds reviewed verbally with pt.     History of Present Illness:    Frederick Clarke is a 66 y.o. male with a hx of diabetes, former smoker x15 years who presents for follow-up.  He was last seen due to chest pain and shortness of breath.  Cardiac work-up with coronary CTA and echocardiogram were performed.  He presents for follow-up.  Denies any symptoms of chest pain or shortness of breath today.  Obtain fasting lipid profile with LDL 121.  Lipitor was recommended, but patient states not taking, as he was not told.  He endorsed taking aspirin 81 mg daily.  Prior notes/studies Echo 12/2020 EF 55 to 60% Coronary CTA 12/2020 mild proximal LAD disease 25%, calcium score 197   Past Medical History:  Diagnosis Date   BPH (benign prostatic hyperplasia)    GERD (gastroesophageal reflux disease)    Gout    H. pylori infection    Hyperlipidemia    Hypertension    Type 2 diabetes mellitus (HCC)     Past Surgical History:  Procedure Laterality Date   NO PAST SURGERIES      Current Medications: Current Meds  Medication Sig   aspirin EC 81 MG tablet Take 1 tablet (81 mg total) by mouth daily. Swallow whole.   Calcium Acetate, Phos Binder, (CALCIUM ACETATE PO) Take by mouth.   Cyanocobalamin (VITAMIN B 12 PO) Take by mouth.   MAGNESIUM PO Take by mouth.   metformin (FORTAMET) 500 MG (OSM) 24 hr tablet Take 1,000 mg by mouth 2 (two) times daily.   Omega-3 Fatty Acids (OMEGA 3 PO) Take by mouth.   polyethylene glycol (MIRALAX / GLYCOLAX) 17 g packet Take 17 g by mouth daily as needed.   POTASSIUM PO Take by mouth.   Turmeric (QC TUMERIC COMPLEX PO) Take by mouth.     Allergies:   Pineapple    Social History   Socioeconomic History   Marital status: Divorced    Spouse name: Not on file   Number of children: 6   Years of education: Not on file   Highest education level: Not on file  Occupational History   Occupation: carpet  Tobacco Use   Smoking status: Former    Types: Cigarettes    Quit date: 1993    Years since quitting: 30.0   Smokeless tobacco: Never  Vaping Use   Vaping Use: Never used  Substance and Sexual Activity   Alcohol use: Yes    Comment: rare   Drug use: Never   Sexual activity: Not on file  Other Topics Concern   Not on file  Social History Narrative   Not on file   Social Determinants of Health   Financial Resource Strain: Not on file  Food Insecurity: Not on file  Transportation Needs: Not on file  Physical Activity: Not on file  Stress: Not on file  Social Connections: Not on file     Family History: The patient's family history includes Diabetes in his mother; Heart disease in his daughter; Hypertension in his mother; Liver cancer in his father; Lung cancer in his brother; Stomach cancer in his father.  There is no history of Colon cancer, Rectal cancer, or Esophageal cancer.  ROS:   Please see the history of present illness.     All other systems reviewed and are negative.  EKGs/Labs/Other Studies Reviewed:    The following studies were reviewed today:   EKG:  EKG not ordered today.    Recent Labs: 12/09/2020: ALT 17; BUN 20; Creatinine, Ser 0.72; Hemoglobin 15.6; Platelets 155; Potassium 3.7; Sodium 133  Recent Lipid Panel    Component Value Date/Time   CHOL 187 12/27/2020 0918   TRIG 73 12/27/2020 0918   HDL 52 12/27/2020 0918   CHOLHDL 3.6 12/27/2020 0918   LDLCALC 121 (H) 12/27/2020 0918     Risk Assessment/Calculations:          Physical Exam:    VS:  BP 120/64 (BP Location: Left Arm, Patient Position: Sitting, Cuff Size: Normal)    Pulse 88    Ht 5\' 6"  (1.676 m)    Wt 187 lb 4 oz (84.9 kg)    SpO2 97%     BMI 30.22 kg/m     Wt Readings from Last 3 Encounters:  01/31/21 187 lb 4 oz (84.9 kg)  01/27/21 184 lb (83.5 kg)  12/08/20 183 lb (83 kg)     GEN:  Well nourished, well developed in no acute distress HEENT: Normal NECK: No JVD; No carotid bruits LYMPHATICS: No lymphadenopathy CARDIAC: RRR, no murmurs, rubs, gallops RESPIRATORY:  Clear to auscultation without rales, wheezing or rhonchi  ABDOMEN: Soft, non-tender, non-distended MUSCULOSKELETAL:  No edema; No deformity  SKIN: Warm and dry NEUROLOGIC:  Alert and oriented x 3 PSYCHIATRIC:  Normal affect   ASSESSMENT:    1. Coronary artery disease involving native coronary artery of native heart without angina pectoris   2. Hyperlipidemia LDL goal <70     PLAN:    In order of problems listed above:  Chest pain, coronary CTA with mild proximal to mid LAD stenosis.  Calcium score 197.  Echo with normal EF 55 to 60%.  Patient with above results, reassured.  Start aspirin 81 mg, start Lipitor 40 mg. LDL not at goal, start Lipitor 40 mg as above.   Follow-up in 6 months.  Obtain fasting lipid profile prior to follow-up visit.       Medication Adjustments/Labs and Tests Ordered: Current medicines are reviewed at length with the patient today.  Concerns regarding medicines are outlined above.  Orders Placed This Encounter  Procedures   Lipid panel   Meds ordered this encounter  Medications   atorvastatin (LIPITOR) 40 MG tablet    Sig: Take 1 tablet (40 mg total) by mouth daily.    Dispense:  30 tablet    Refill:  5    Patient Instructions  Medication Instructions:  Your physician recommends that you continue on your current medications as directed. Please refer to the Current Medication list given to you today.  A refill for Atorvastatin has been sent to your pharmacy.  *If you need a refill on your cardiac medications before your next appointment, please call your pharmacy*   Lab Work: Your physician recommends  that you return for a FASTING lipid profile: in 6 months  If you have labs (blood work) drawn today and your tests are completely normal, you will receive your results only by: [place] (if you have MyChart) OR A paper copy in the mail If you have any lab test that is abnormal or we need to change your treatment,  we will call you to review the results.   Testing/Procedures: None ordered   Follow-Up: At [facility], you and your health needs are our priority.  As part of our continuing mission to provide you with exceptional heart care, we have created designated Provider Care Teams.  These Care Teams include your primary Cardiologist (physician) and Advanced Practice Providers (APPs -  Physician Assistants and Nurse Practitioners) who all work together to provide you with the care you need, when you need it.  We recommend signing up for the patient portal called "MyChart".  Sign up information is provided on this After Visit Summary.  MyChart is used to connect with patients for Virtual Visits (Telemedicine).  Patients are able to view lab/test results, encounter notes, upcoming appointments, etc.  Non-urgent messages can be sent to your provider as well.   To learn more about what you can do with MyChart, go to [email or web address].    Your next appointment:   Your physician wants you to follow-up in: 6 months You will receive a reminder letter in the mail two months in advance. If you don't receive a letter, please call our office to schedule the follow-up appointment.   The format for your next appointment:   In Person  Provider:   You may see Kate Sable, MD or one of the following Advanced Practice Providers on your designated Care Team:   Murray Hodgkins, NP Christell Faith, PA-C Cadence Kathlen Mody, [place]    Other Instructions N/A    Signed, Kate Sable, MD  01/31/2021 10:36 AM    [place]

## 2021-02-02 ENCOUNTER — Ambulatory Visit: Payer: Self-pay | Admitting: *Deleted

## 2021-02-02 ENCOUNTER — Emergency Department (HOSPITAL_COMMUNITY)
Admission: EM | Admit: 2021-02-02 | Discharge: 2021-02-02 | Disposition: A | Discharge status: Home / Self Care | Payer: 59 | Attending: Emergency Medicine | Admitting: Emergency Medicine

## 2021-02-02 DIAGNOSIS — E119 Type 2 diabetes mellitus without complications: Secondary | ICD-10-CM | POA: Insufficient documentation

## 2021-02-02 DIAGNOSIS — L299 Pruritus, unspecified: Secondary | ICD-10-CM | POA: Insufficient documentation

## 2021-02-02 DIAGNOSIS — R21 Rash and other nonspecific skin eruption: Secondary | ICD-10-CM | POA: Diagnosis present

## 2021-02-02 DIAGNOSIS — Z7982 Long term (current) use of aspirin: Secondary | ICD-10-CM | POA: Diagnosis not present

## 2021-02-02 MED ORDER — DIPHENHYDRAMINE HCL 25 MG PO TABS: 25.0000 mg | ORAL_TABLET | Freq: Four times a day (QID) | ORAL | 0 refills | Status: AC

## 2021-02-02 MED ORDER — FAMOTIDINE 20 MG PO TABS
20.0000 mg | ORAL_TABLET | Freq: Two times a day (BID) | ORAL | 0 refills | Status: AC
Start: 2021-02-02 — End: ?

## 2021-02-02 NOTE — ED Triage Notes (Signed)
Pt. Stated, Frederick Clarke had burning and itching in various places everywhere since last Sat.

## 2021-02-02 NOTE — ED Provider Notes (Signed)
[facility] EMERGENCY DEPARTMENT Provider Note   CSN: [id number] Arrival date & time: 02/02/21  0856     History  Chief Complaint  Patient presents with   Pruritis    Frederick Clarke is a 66 y.o. male.  The history is provided by the patient and medical records. No language interpreter was used.    66 year old male with history of diabetes presenting with complaints of a rash.  Patient had a colonoscopy.  Several days prior afterward he developed itchiness sensation which initially started around his buttock region but has since travels throughout his body.  He has been scratching at it.  He noted symptoms moderate severity no associated chest pain or shortness of breath no abdominal cramping lightheadedness or dizziness or fever.  No other environmental changes.  He thought this could be related to shingles would like to be evaluated.  He tried some lidocaine patch over the site with some improvement.  Home Medications Prior to Admission medications   Medication Sig Start Date End Date Taking? Authorizing Provider  aspirin EC 81 MG tablet Take 1 tablet (81 mg total) by mouth daily. Swallow whole. 12/23/20   Debbe Odea, MD  atorvastatin (LIPITOR) 40 MG tablet Take 1 tablet (40 mg total) by mouth daily. 01/31/21 05/01/21  Debbe Odea, MD  Calcium Acetate, Phos Binder, (CALCIUM ACETATE PO) Take by mouth.    [provider]  Cyanocobalamin (VITAMIN B 12 PO) Take by mouth.    [provider]  JARDIANCE 25 MG TABS tablet Take 25 mg by mouth daily. Patient not taking: Reported on 01/31/2021 12/20/20   [provider]  MAGNESIUM PO Take by mouth.    [provider]  metformin (FORTAMET) 500 MG (OSM) 24 hr tablet Take 1,000 mg by mouth 2 (two) times daily. 01/11/21   [provider]  metoprolol tartrate (LOPRESSOR) 100 MG tablet Take 1 tablet (100 mg total) by mouth once for 1 dose. Take 2 hours prior to your CT  scan. Patient not taking: Reported on 01/31/2021 12/08/20 01/31/21  Debbe Odea, MD  Omega-3 Fatty Acids (OMEGA 3 PO) Take by mouth.    [provider]  omeprazole (PRILOSEC) 20 MG capsule Take 1 capsule (20 mg total) by mouth daily. Patient not taking: Reported on 01/27/2021 12/10/20   Horton, Mayer Masker, MD  ondansetron (ZOFRAN ODT) 4 MG disintegrating tablet Take 1 tablet (4 mg total) by mouth every 8 (eight) hours as needed for nausea or vomiting. Patient not taking: Reported on 01/31/2021 12/10/20   Horton, Mayer Masker, MD  polyethylene glycol (MIRALAX / GLYCOLAX) 17 g packet Take 17 g by mouth daily as needed.    [provider]  POTASSIUM PO Take by mouth.    [provider]  tamsulosin (FLOMAX) 0.4 MG CAPS capsule Take 0.4 mg by mouth daily. Patient not taking: Reported on 01/31/2021 10/18/20   [provider]  Turmeric (QC TUMERIC COMPLEX PO) Take by mouth.    [provider]      Allergies    Pineapple    Review of Systems   Review of Systems  All other systems reviewed and are negative.  Physical Exam Updated Vital Signs BP (!) 158/102 (BP Location: Right Arm)    Pulse 65    Temp (!) 97.5 F (36.4 C) (Oral)    Resp 14    SpO2 100%  Physical Exam Vitals and nursing note reviewed.  Constitutional:      General:  He is not in acute distress.    Appearance: He is well-developed.  HENT:     Head: Atraumatic.  Eyes:     Conjunctiva/sclera: Conjunctivae normal.  Cardiovascular:     Rate and Rhythm: Normal rate and regular rhythm.     Pulses: Normal pulses.     Heart sounds: Normal heart sounds.  Pulmonary:     Effort: Pulmonary effort is normal.     Breath sounds: Normal breath sounds. No wheezing.  Abdominal:     Palpations: Abdomen is soft.  Musculoskeletal:     Cervical back: Neck supple.  Skin:    Findings: Rash (Multiple excoriation marks noted to bilateral face, lower back and buttock region without any vesicular rash  no petechial rash and no pustular lesion.) present.  Neurological:     Mental Status: He is alert. Mental status is at baseline.    ED Results / Procedures / Treatments   Labs (all labs ordered are listed, but only abnormal results are displayed) Labs Reviewed - No data to display  EKG None  Radiology No results found.  Procedures Procedures    Medications Ordered in ED Medications - No data to display  ED Course/ Medical Decision Making/ A&P                           Medical Decision Making  BP (!) 158/102 (BP Location: Right Arm)    Pulse 65    Temp (!) 97.5 F (36.4 C) (Oral)    Resp 14    SpO2 100%   9:31 AM Patient developed an itchy rash after colonoscopy several days prior.  He initially thought this could be related to shingles however rash is not consistent with shingles.  It is likely an allergic reaction to product from his recent colonoscopy.  He does not have any anaphylactic reaction.  He would likely benefit from prednisone and Benadryl, unfortunately he is a diabetic and therefore I felt prednisone could be more harmful than helpful.  Will prescribe Benadryl, as well as allergist referral as needed.  Return precaution given.        Final Clinical Impression(s) / ED Diagnoses Final diagnoses:  Pruritus    Rx / DC Orders ED Discharge Orders          Ordered    diphenhydrAMINE (BENADRYL) 25 MG tablet  Every 6 hours        02/02/21 0932    famotidine (PEPCID) 20 MG tablet  2 times daily        02/02/21 0932              Fayrene Helper, PA-C 02/02/21 0354    Bethann Berkshire, MD 02/04/21 1101

## 2021-02-02 NOTE — Telephone Encounter (Signed)
Pt called in on the community line using a Spanish interpreter.  No PCP.   Reason for Disposition  Mild widespread rash  Answer Assessment - Initial Assessment Questions 1. APPEARANCE of RASH: "Describe the rash." (e.g., spots, blisters, raised areas, skin peeling, scaly)     He has a rash on his back that is now on his face.    Called in using spanish interpreter. It's burning on my face.   I'm using an ointment but the rash on my face is burning and itching.   I asked if he had a PCP, He does but can't get in. There are no appts with him.   I suggested he go to the urgent care.  He wanted to know if I could prescribe something for the rash.   I let him know I could not he would need to see a provider.   I have diabetes so I feel I need to be seen soon.   I again suggested the urgent care or call his PCP back and see if he could be worked in. I've been home.   No exposure to poison ivy.  He was agreeable to going to the urgent care if he can't get in with his PCP today.   2. SIZE: "How big are the spots?" (e.g., tip of pen, eraser, coin; inches, centimeters)     *No Answer* 3. LOCATION: "Where is the rash located?"     It started on my lower back and is spreading. 4. COLOR: "What color is the rash?" (Note: It is difficult to assess rash color in people with darker-colored skin. When this situation occurs, simply ask the caller to describe what they see.)     *No Answer* 5. ONSET: "When did the rash begin?"     *No Answer* 6. FEVER: "Do you have a fever?" If Yes, ask: "What is your temperature, how was it measured, and when did it start?"     *No Answer* 7. ITCHING: "Does the rash itch?" If Yes, ask: "How bad is the itch?" (Scale 1-10; or mild, moderate, severe)     *No Answer* 8. CAUSE: "What do you think is causing the rash?"     *No Answer* 9. MEDICINE FACTORS: "Have you started any new medicines within the last 2 weeks?" (e.g., antibiotics)      *No Answer* 10. OTHER SYMPTOMS: "Do  you have any other symptoms?" (e.g., dizziness, headache, sore throat, joint pain)       *No Answer* 11. PREGNANCY: "Is there any chance you are pregnant?" "When was your last menstrual period?"       *No Answer*  Protocols used: Rash or Redness - [facility]

## 2021-02-02 NOTE — Discharge Instructions (Addendum)
Your itchiness is likely due to an allergic reaction possibly from a product use for your colonoscopy.  Your rash is not consistent with shingle.  Please take Benadryl as needed for itchiness and take Pepcid as well.  If symptoms persist, you may follow-up with an allergist.  Return if you develop shortness of breath, fever, headache, or if you have any other concern.

## 2021-02-02 NOTE — Telephone Encounter (Addendum)
°  Chief Complaint: rash that is spreading and itching Symptoms: itching, burning Frequency: spreading Pertinent Negatives: Patient denies exposure to poison ivy. Disposition: [] ED /[x] Urgent Care (no appt availability in office) / [] Appointment(In office/virtual)/ []  [place]/ [] Home Care/ [] Refused Recommended Disposition /[] [place] [facility]/ []  Follow-up with PCP Additional Notes: Agreeable to going to the urgent care if can't get worked in with his PCP today.   He is going to try and call them back.  Pt called in using Spanish interpreter Hennie Duos [phone].

## 2021-02-07 ENCOUNTER — Encounter: Payer: Self-pay | Admitting: Gastroenterology

## 2021-03-28 DIAGNOSIS — Z23 Encounter for immunization: Secondary | ICD-10-CM | POA: Diagnosis not present

## 2021-03-28 DIAGNOSIS — R682 Dry mouth, unspecified: Secondary | ICD-10-CM | POA: Diagnosis not present

## 2021-03-28 DIAGNOSIS — E119 Type 2 diabetes mellitus without complications: Secondary | ICD-10-CM | POA: Diagnosis not present

## 2021-06-05 DIAGNOSIS — R0981 Nasal congestion: Secondary | ICD-10-CM | POA: Diagnosis not present

## 2021-06-05 DIAGNOSIS — T485X5A Adverse effect of other anti-common-cold drugs, initial encounter: Secondary | ICD-10-CM | POA: Diagnosis not present

## 2021-06-05 DIAGNOSIS — J310 Chronic rhinitis: Secondary | ICD-10-CM | POA: Diagnosis not present

## 2021-06-05 DIAGNOSIS — R682 Dry mouth, unspecified: Secondary | ICD-10-CM | POA: Diagnosis not present

## 2021-06-05 DIAGNOSIS — Z87891 Personal history of nicotine dependence: Secondary | ICD-10-CM | POA: Diagnosis not present

## 2021-08-29 DIAGNOSIS — H35373 Puckering of macula, bilateral: Secondary | ICD-10-CM | POA: Diagnosis not present

## 2021-08-29 DIAGNOSIS — H2513 Age-related nuclear cataract, bilateral: Secondary | ICD-10-CM | POA: Diagnosis not present

## 2021-08-29 DIAGNOSIS — H43813 Vitreous degeneration, bilateral: Secondary | ICD-10-CM | POA: Diagnosis not present

## 2021-08-29 DIAGNOSIS — E119 Type 2 diabetes mellitus without complications: Secondary | ICD-10-CM | POA: Diagnosis not present

## 2021-10-30 DIAGNOSIS — Z20822 Contact with and (suspected) exposure to covid-19: Secondary | ICD-10-CM | POA: Diagnosis not present

## 2021-10-30 DIAGNOSIS — Z712 Person consulting for explanation of examination or test findings: Secondary | ICD-10-CM | POA: Diagnosis not present

## 2021-10-30 DIAGNOSIS — U071 COVID-19: Secondary | ICD-10-CM | POA: Diagnosis not present

## 2021-10-30 DIAGNOSIS — E119 Type 2 diabetes mellitus without complications: Secondary | ICD-10-CM | POA: Diagnosis not present

## 2022-01-23 DIAGNOSIS — R7309 Other abnormal glucose: Secondary | ICD-10-CM | POA: Diagnosis not present

## 2022-01-23 DIAGNOSIS — E785 Hyperlipidemia, unspecified: Secondary | ICD-10-CM | POA: Diagnosis not present

## 2022-01-23 DIAGNOSIS — E119 Type 2 diabetes mellitus without complications: Secondary | ICD-10-CM | POA: Diagnosis not present

## 2022-01-23 DIAGNOSIS — Z1389 Encounter for screening for other disorder: Secondary | ICD-10-CM | POA: Diagnosis not present

## 2022-01-23 DIAGNOSIS — N400 Enlarged prostate without lower urinary tract symptoms: Secondary | ICD-10-CM | POA: Diagnosis not present

## 2022-01-23 DIAGNOSIS — R300 Dysuria: Secondary | ICD-10-CM | POA: Diagnosis not present

## 2022-01-23 DIAGNOSIS — Z712 Person consulting for explanation of examination or test findings: Secondary | ICD-10-CM | POA: Diagnosis not present

## 2022-01-24 DIAGNOSIS — E119 Type 2 diabetes mellitus without complications: Secondary | ICD-10-CM | POA: Diagnosis not present

## 2022-02-02 DIAGNOSIS — Z1389 Encounter for screening for other disorder: Secondary | ICD-10-CM | POA: Diagnosis not present

## 2022-02-02 DIAGNOSIS — E875 Hyperkalemia: Secondary | ICD-10-CM | POA: Diagnosis not present

## 2022-02-02 DIAGNOSIS — Z712 Person consulting for explanation of examination or test findings: Secondary | ICD-10-CM | POA: Diagnosis not present

## 2022-02-02 DIAGNOSIS — R682 Dry mouth, unspecified: Secondary | ICD-10-CM | POA: Diagnosis not present

## 2022-02-02 DIAGNOSIS — E119 Type 2 diabetes mellitus without complications: Secondary | ICD-10-CM | POA: Diagnosis not present

## 2022-09-13 DIAGNOSIS — Z1389 Encounter for screening for other disorder: Secondary | ICD-10-CM | POA: Diagnosis not present

## 2022-09-13 DIAGNOSIS — Z1331 Encounter for screening for depression: Secondary | ICD-10-CM | POA: Diagnosis not present

## 2022-09-13 DIAGNOSIS — B354 Tinea corporis: Secondary | ICD-10-CM | POA: Diagnosis not present

## 2022-09-13 DIAGNOSIS — Z712 Person consulting for explanation of examination or test findings: Secondary | ICD-10-CM | POA: Diagnosis not present

## 2022-09-13 DIAGNOSIS — B351 Tinea unguium: Secondary | ICD-10-CM | POA: Diagnosis not present

## 2022-09-13 DIAGNOSIS — D179 Benign lipomatous neoplasm, unspecified: Secondary | ICD-10-CM | POA: Diagnosis not present

## 2022-09-13 DIAGNOSIS — E119 Type 2 diabetes mellitus without complications: Secondary | ICD-10-CM | POA: Diagnosis not present

## 2022-09-13 DIAGNOSIS — R030 Elevated blood-pressure reading, without diagnosis of hypertension: Secondary | ICD-10-CM | POA: Diagnosis not present

## 2022-09-27 DIAGNOSIS — Z1331 Encounter for screening for depression: Secondary | ICD-10-CM | POA: Diagnosis not present

## 2022-09-27 DIAGNOSIS — E119 Type 2 diabetes mellitus without complications: Secondary | ICD-10-CM | POA: Diagnosis not present

## 2022-09-27 DIAGNOSIS — Z712 Person consulting for explanation of examination or test findings: Secondary | ICD-10-CM | POA: Diagnosis not present

## 2022-09-27 DIAGNOSIS — Z1389 Encounter for screening for other disorder: Secondary | ICD-10-CM | POA: Diagnosis not present

## 2022-09-27 DIAGNOSIS — Z0000 Encounter for general adult medical examination without abnormal findings: Secondary | ICD-10-CM | POA: Diagnosis not present

## 2023-01-03 ENCOUNTER — Encounter: Payer: Self-pay | Admitting: Cardiology

## 2023-01-03 ENCOUNTER — Ambulatory Visit: Payer: Medicare (Managed Care) | Attending: Cardiology | Admitting: Cardiology

## 2023-01-03 VITALS — BP 126/72 | HR 62 | Ht 67.0 in | Wt 180.8 lb

## 2023-01-03 DIAGNOSIS — E785 Hyperlipidemia, unspecified: Secondary | ICD-10-CM | POA: Diagnosis not present

## 2023-01-03 DIAGNOSIS — I2510 Atherosclerotic heart disease of native coronary artery without angina pectoris: Secondary | ICD-10-CM | POA: Diagnosis not present

## 2023-01-03 NOTE — Patient Instructions (Signed)
Medication Instructions:   Your physician recommends that you continue on your current medications as directed. Please refer to the Current Medication list given to you today.  *If you need a refill on your cardiac medications before your next appointment, please call your pharmacy*   Lab Work:  Your physician recommends you have labs - LIPID  If you have labs (blood work) drawn today and your tests are completely normal, you will receive your results only by: MyChart Message (if you have MyChart) OR A paper copy in the mail If you have any lab test that is abnormal or we need to change your treatment, we will call you to review the results.   Testing/Procedures:  None Ordered   Follow-Up: At [facility], you and your health needs are our priority.  As part of our continuing mission to provide you with exceptional heart care, we have created designated Provider Care Teams.  These Care Teams include your primary Cardiologist (physician) and Advanced Practice Providers (APPs -  Physician Assistants and Nurse Practitioners) who all work together to provide you with the care you need, when you need it.  We recommend signing up for the patient portal called "MyChart".  Sign up information is provided on this After Visit Summary.  MyChart is used to connect with patients for Virtual Visits (Telemedicine).  Patients are able to view lab/test results, encounter notes, upcoming appointments, etc.  Non-urgent messages can be sent to your provider as well.   To learn more about what you can do with MyChart, go to [email or web address].    Your next appointment:   12 month(s)  Provider:   You may see Debbe Odea, MD or one of the following Advanced Practice Providers on your designated Care Team:   Nicolasa Ducking, NP Eula Listen, PA-C Cadence Fransico Michael, PA-C Charlsie Quest, NP Carlos Levering, NP

## 2023-01-03 NOTE — Progress Notes (Signed)
Cardiology Office Note:    Date:  01/03/2023   ID:  Frederick Clarke, DOB July 20, 1955, MRN [id number]  PCP:  Aviva Kluver   [facility] Providers Cardiologist:  Debbe Odea, MD     Referring MD: No ref. provider found   Chief Complaint  Patient presents with   Follow-up    Patient denies new or acute cardiac problems/concerns today.       History of Present Illness:    Frederick Clarke is a 67 y.o. male with a hx of nonobstructive CAD (mild proximal LAD disease on CCTA 2022 ), diabetes, former smoker x15 years who presents for follow-up.     Last seen with symptoms of chest pain, workup with coronary CT showing nonobstructive disease.  Started on aspirin, Lipitor.  Repeat lipid profile was recommended prior to follow-up visit.  Cholesterol blood work still pending.  He denies chest pain or shortness of breath.  Compliant with medications including aspirin, Lipitor as prescribed.  Prior notes/studies Echo 12/2020 EF 55 to 60% Coronary CTA 12/2020 mild proximal LAD disease 25%, calcium score 197   Past Medical History:  Diagnosis Date   BPH (benign prostatic hyperplasia)    GERD (gastroesophageal reflux disease)    Gout    H. pylori infection    Hyperlipidemia    Hypertension    Type 2 diabetes mellitus (HCC)     Past Surgical History:  Procedure Laterality Date   NO PAST SURGERIES      Current Medications: Current Meds  Medication Sig   aspirin EC 81 MG tablet Take 1 tablet (81 mg total) by mouth daily. Swallow whole.   atorvastatin (LIPITOR) 40 MG tablet Take 1 tablet (40 mg total) by mouth daily.   JARDIANCE 25 MG TABS tablet Take 25 mg by mouth daily.   metformin (FORTAMET) 500 MG (OSM) 24 hr tablet Take 1,000 mg by mouth 2 (two) times daily.     Allergies:   Pineapple   Social History   Socioeconomic History   Marital status: Divorced    Spouse name: Not on file   Number of children: 6   Years of education: Not on file   Highest education  level: Not on file  Occupational History   Occupation: carpet  Tobacco Use   Smoking status: Former    Current packs/day: 0.00    Types: Cigarettes    Quit date: 1993    Years since quitting: 31.9   Smokeless tobacco: Never  Vaping Use   Vaping status: Never Used  Substance and Sexual Activity   Alcohol use: Yes    Comment: rare   Drug use: Never   Sexual activity: Not on file  Other Topics Concern   Not on file  Social History Narrative   Not on file   Social Drivers of Health   Financial Resource Strain: Not on file  Food Insecurity: Not on file  Transportation Needs: Not on file  Physical Activity: Not on file  Stress: Not on file  Social Connections: Not on file     Family History: The patient's family history includes Diabetes in his mother; Heart disease in his daughter; Hypertension in his mother; Liver cancer in his father; Lung cancer in his brother; Stomach cancer in his father. There is no history of Colon cancer, Rectal cancer, or Esophageal cancer.  ROS:   Please see the history of present illness.     All other systems reviewed and are negative.  EKGs/Labs/Other Studies Reviewed:  The following studies were reviewed today:   EKG Interpretation Date/Time:  Thursday January 03 2023 10:36:38 EST Ventricular Rate:  62 PR Interval:  190 QRS Duration:  106 QT Interval:  438 QTC Calculation: 444 R Axis:   -16  Text Interpretation: Normal sinus rhythm Normal ECG Confirmed by Debbe Odea ([id number]) on 01/03/2023 10:45:17 AM    Recent Labs: No results found for requested labs within last 365 days.  Recent Lipid Panel    Component Value Date/Time   CHOL 187 12/27/2020 0918   TRIG 73 12/27/2020 0918   HDL 52 12/27/2020 0918   CHOLHDL 3.6 12/27/2020 0918   LDLCALC 121 (H) 12/27/2020 0918     Risk Assessment/Calculations:          Physical Exam:    VS:  BP 126/72 (BP Location: Left Arm, Patient Position: Sitting, Cuff Size: Large)    Pulse 62   Ht 5\' 7"  (1.702 m)   Wt 180 lb 12.8 oz (82 kg)   SpO2 99%   BMI 28.32 kg/m     Wt Readings from Last 3 Encounters:  01/03/23 180 lb 12.8 oz (82 kg)  01/31/21 187 lb 4 oz (84.9 kg)  01/27/21 184 lb (83.5 kg)     GEN:  Well nourished, well developed in no acute distress HEENT: Normal NECK: No JVD; No carotid bruits LYMPHATICS: No lymphadenopathy CARDIAC: RRR, no murmurs, rubs, gallops RESPIRATORY:  Clear to auscultation without rales, wheezing or rhonchi  ABDOMEN: Soft, non-tender, non-distended MUSCULOSKELETAL:  No edema; No deformity  SKIN: Warm and dry NEUROLOGIC:  Alert and oriented x 3 PSYCHIATRIC:  Normal affect   ASSESSMENT:    1. Coronary artery disease involving native coronary artery of native heart without angina pectoris   2. Hyperlipidemia LDL goal <70     PLAN:    In order of problems listed above:  Nonobstructive CAD coronary CTA 12/22 with mild proximal to mid LAD stenosis.  Calcium score 197.  Denies chest pain or shortness of breath.  EF 55 to 60%.  Continue aspirin 81 mg, start Lipitor 40 mg. Goal LDL less than 70.  Continue Lipitor 40 mg daily.  Obtain fasting lipid profile.    Follow-up in 12 months.         Medication Adjustments/Labs and Tests Ordered: Current medicines are reviewed at length with the patient today.  Concerns regarding medicines are outlined above.  Orders Placed This Encounter  Procedures   Lipid panel   EKG 12-Lead   No orders of the defined types were placed in this encounter.   Patient Instructions  Medication Instructions:   Your physician recommends that you continue on your current medications as directed. Please refer to the Current Medication list given to you today.  *If you need a refill on your cardiac medications before your next appointment, please call your pharmacy*   Lab Work:  Your physician recommends you have labs - LIPID  If you have labs (blood work) drawn today and your tests are  completely normal, you will receive your results only by: MyChart Message (if you have MyChart) OR A paper copy in the mail If you have any lab test that is abnormal or we need to change your treatment, we will call you to review the results.   Testing/Procedures:  None Ordered   Follow-Up: At [facility], you and your health needs are our priority.  As part of our continuing mission to provide you with exceptional heart care, we have  created designated Provider Care Teams.  These Care Teams include your primary Cardiologist (physician) and Advanced Practice Providers (APPs -  Physician Assistants and Nurse Practitioners) who all work together to provide you with the care you need, when you need it.  We recommend signing up for the patient portal called "MyChart".  Sign up information is provided on this After Visit Summary.  MyChart is used to connect with patients for Virtual Visits (Telemedicine).  Patients are able to view lab/test results, encounter notes, upcoming appointments, etc.  Non-urgent messages can be sent to your provider as well.   To learn more about what you can do with MyChart, go to [email or web address].    Your next appointment:   12 month(s)  Provider:   You may see Debbe Odea, MD or one of the following Advanced Practice Providers on your designated Care Team:   Nicolasa Ducking, NP Eula Listen, PA-C Cadence Fransico Michael, PA-C Charlsie Quest, NP Carlos Levering, NP   Signed, Debbe Odea, MD  01/03/2023 11:10 AM    [facility]

## 2023-01-04 LAB — LIPID PANEL
Chol/HDL Ratio: 3.2 {ratio} (ref 0.0–5.0)
Cholesterol, Total: 173 mg/dL (ref 100–199)
HDL: 54 mg/dL (ref >39)
LDL Chol Calc (NIH): 103 mg/dL — ABNORMAL HIGH (ref 0–99)
Triglycerides: 84 mg/dL (ref 0–149)
VLDL Cholesterol Cal: 16 mg/dL (ref 5–40)

## 2023-02-01 ENCOUNTER — Other Ambulatory Visit: Payer: Self-pay

## 2023-02-01 MED ORDER — ATORVASTATIN CALCIUM 80 MG PO TABS: 80.0000 mg | ORAL_TABLET | Freq: Every day | ORAL | 3 refills | Status: AC

## 2023-04-03 DIAGNOSIS — M25511 Pain in right shoulder: Secondary | ICD-10-CM | POA: Diagnosis not present

## 2023-04-15 DIAGNOSIS — M25511 Pain in right shoulder: Secondary | ICD-10-CM | POA: Diagnosis not present

## 2023-04-20 IMAGING — CT CT HEART MORP W/ CTA COR W/ SCORE W/ CA W/CM &/OR W/O CM
2 of 11 series · 7 of 20 positions shown, 8 images · non-contrast
Comparison: 04/06/2015 chest radiograph.

Addendum:
CLINICAL DATA: Chest pain

EXAM:
Cardiac/Coronary  CTA
TECHNIQUE: The patient was scanned on a Siemens Somatom go.Top scanner.

[Series 25: multiphase % cta coronary 0.60 · axial · 0.37mm/px · z∈[-1122,-1050]mm · 4 of 3311 slices shown, 5 images]
[im 663/3311  vessel]
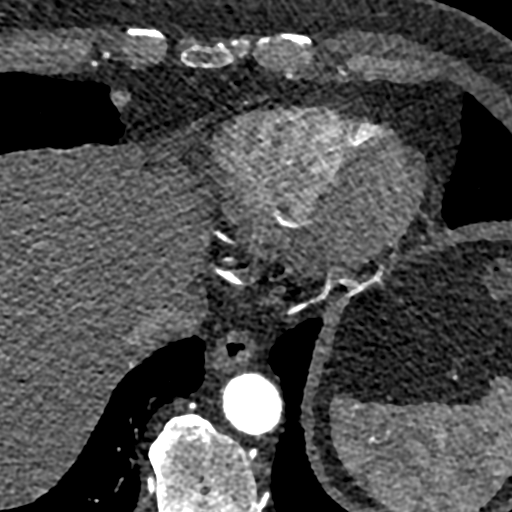
[im 663/3311  lung]
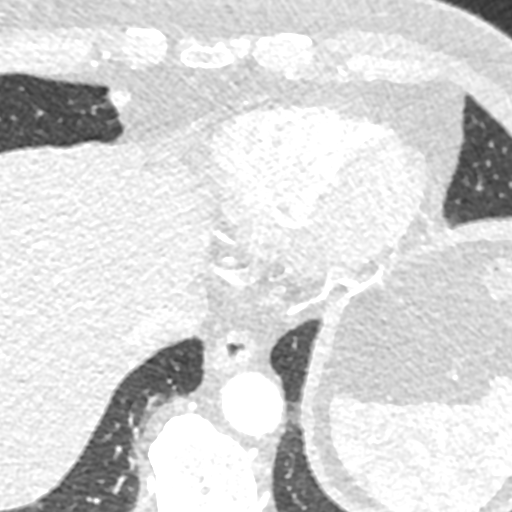
[im 1325/3311  vessel]
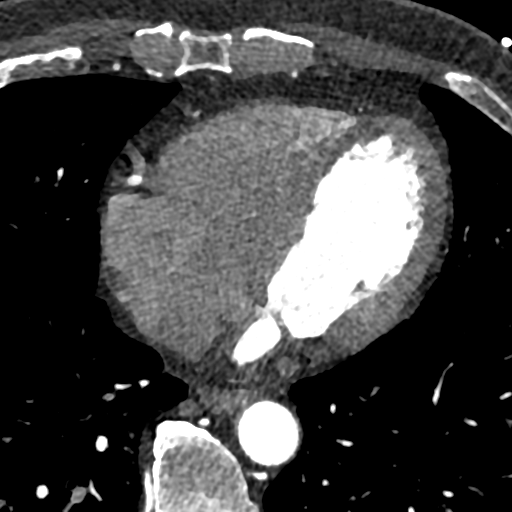
[im 1987/3311  vessel]
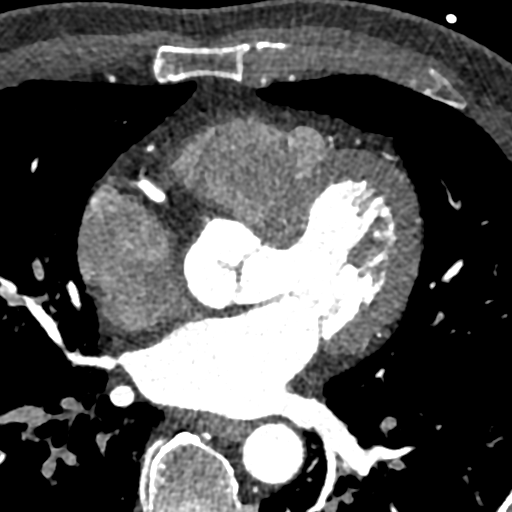
[im 2649/3311  vessel]
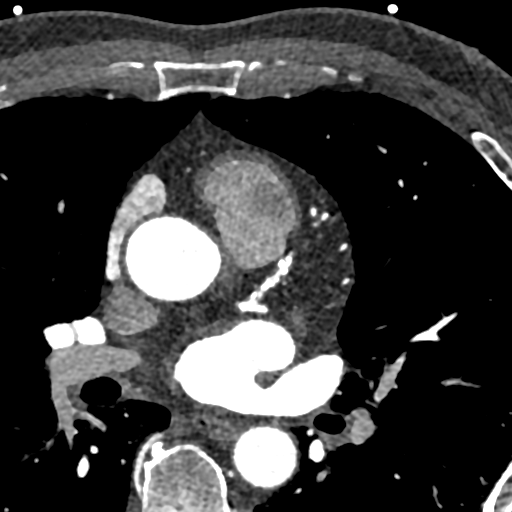

[Series 38: ms multiphase cta coronary 0.60 · axial · 0.37mm/px · z∈[-1116,-1056]mm · 3 of 2709 slices shown]
[im 678/2709  vessel]
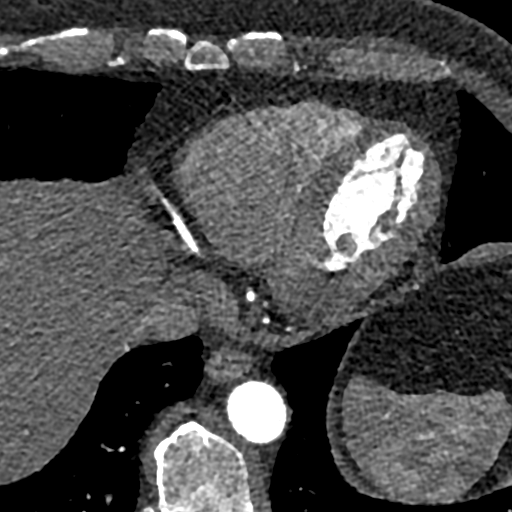
[im 1355/2709  vessel]
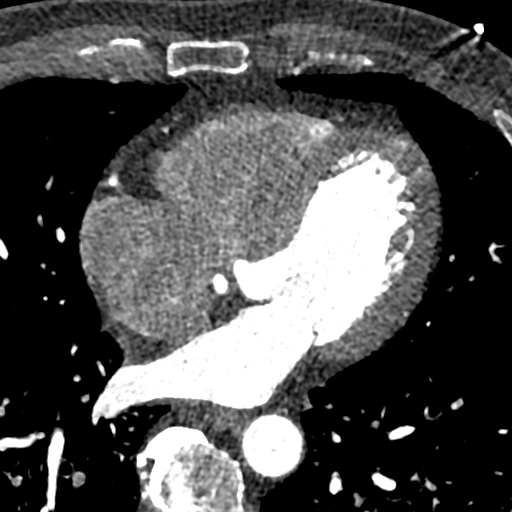
[im 2032/2709  vessel]
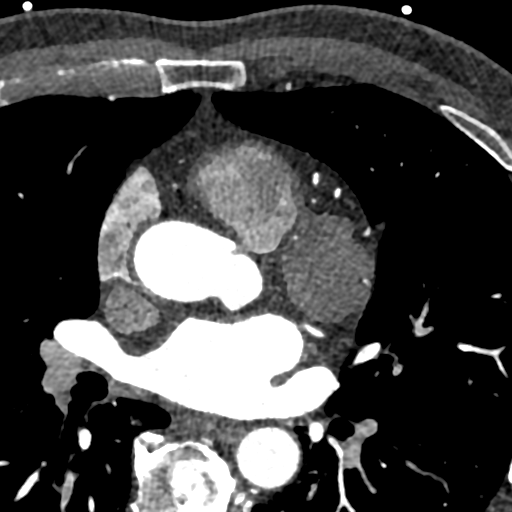

[7 of 20 positions shown; findings below may reference images not displayed]

:
A retrospective scan was triggered in the descending thoracic aorta.
Axial non-contrast 3 mm slices were carried out through the heart.
The data set was analyzed on a dedicated work station and scored
using the Agatson method. Gantry rotation speed was 330 msecs and
collimation was .6 mm. 100mg of metoprolol and 0.8 mg of sl NTG was
given. The 3D data set was reconstructed in 5% intervals of the
60-95 % of the R-R cycle. Diastolic phases were analyzed on a
dedicated work station using MPR, MIP and VRT modes. The patient
received 75 cc of contrast.
FINDINGS: Aorta: Normal size. Mild aortic root calcifications. No dissection.

Aortic Valve:  Trileaflet.  No calcifications.

Coronary Arteries:  Normal coronary origin.  Right dominance.

RCA is a dominant artery that gives rise to PDA and PLA. There is no
plaque.

Left main is a large artery that gives rise to LAD and LCX arteries.
There is no LM disease.

LAD has calcified plaque in the proximal to mid vessel causing mild
(25-49%) stenosis.

LCX is a non-dominant artery that gives rise to one large OM1
branch. There is no plaque.

Other findings:

Normal pulmonary vein drainage into the left atrium.

Normal left atrial appendage without a thrombus.

Normal size of the pulmonary artery.
IMPRESSION: 1. Coronary calcium score of 197. This was 81st percentile for age
and sex matched control.

2. Normal coronary origin with right dominance.

3. Mild proximal to mid LAD stenosis .

4. CAD-RADS 2. Mild non-obstructive CAD (25-49%). Consider
non-atherosclerotic causes of chest pain. Consider preventive
therapy and risk factor modification.

EXAM:
OVER-READ INTERPRETATION  CT CHEST

The following report is an over-read performed by radiologist Dr.
Xisong Weber-Mollinger [[email or web address]] on 12/22/2020. This over-read
does not include interpretation of cardiac or coronary anatomy or
pathology. The coronary CTA interpretation by the cardiologist is
attached.
FINDINGS: Vascular: Normal aortic caliber. No central pulmonary embolism, on
this non-dedicated study.

Mediastinum/Nodes: No imaged thoracic adenopathy.

Lungs/Pleura: No pleural fluid. Subpleural lymph node along the
right minor fissure on [DATE].

Subpleural 4 mm right lower lobe pulmonary nodule on [DATE].

Upper Abdomen: Normal imaged portions of the liver, spleen, stomach.
A a low-density structure adjacent the spleen at 1.6 cm on 60/9 is
likely an exophytic renal cyst when correlated with 09/16/2008
abdominal CT. Incompletely imaged.

Musculoskeletal: No acute osseous abnormality. Lower thoracic
spondylosis
IMPRESSION: 1.  No acute findings in the imaged extracardiac chest.
2. 4 mm right lower lobe pulmonary nodule. No follow-up needed if
patient is low-risk. Non-contrast chest CT can be considered in 12
months if patient is high-risk. This recommendation follows the
consensus statement: Guidelines for Management of Incidental
Pulmonary Nodules Detected on CT Images: From the [HOSPITAL]

*** End of Addendum ***
:
A retrospective scan was triggered in the descending thoracic aorta.
Axial non-contrast 3 mm slices were carried out through the heart.
The data set was analyzed on a dedicated work station and scored
using the Agatson method. Gantry rotation speed was 330 msecs and
collimation was .6 mm. 100mg of metoprolol and 0.8 mg of sl NTG was
given. The 3D data set was reconstructed in 5% intervals of the
60-95 % of the R-R cycle. Diastolic phases were analyzed on a
dedicated work station using MPR, MIP and VRT modes. The patient
received 75 cc of contrast.
FINDINGS: Aorta: Normal size. Mild aortic root calcifications. No dissection.

Aortic Valve:  Trileaflet.  No calcifications.

Coronary Arteries:  Normal coronary origin.  Right dominance.

RCA is a dominant artery that gives rise to PDA and PLA. There is no
plaque.

Left main is a large artery that gives rise to LAD and LCX arteries.
There is no LM disease.

LAD has calcified plaque in the proximal to mid vessel causing mild
(25-49%) stenosis.

LCX is a non-dominant artery that gives rise to one large OM1
branch. There is no plaque.

Other findings:

Normal pulmonary vein drainage into the left atrium.

Normal left atrial appendage without a thrombus.

Normal size of the pulmonary artery.
IMPRESSION: 1. Coronary calcium score of 197. This was 81st percentile for age
and sex matched control.

2. Normal coronary origin with right dominance.

3. Mild proximal to mid LAD stenosis .

4. CAD-RADS 2. Mild non-obstructive CAD (25-49%). Consider
non-atherosclerotic causes of chest pain. Consider preventive
therapy and risk factor modification.

## 2023-04-22 DIAGNOSIS — M25511 Pain in right shoulder: Secondary | ICD-10-CM | POA: Diagnosis not present

## 2023-04-26 DIAGNOSIS — M25511 Pain in right shoulder: Secondary | ICD-10-CM | POA: Diagnosis not present

## 2023-04-29 DIAGNOSIS — M25511 Pain in right shoulder: Secondary | ICD-10-CM | POA: Diagnosis not present

## 2023-05-01 DIAGNOSIS — M75121 Complete rotator cuff tear or rupture of right shoulder, not specified as traumatic: Secondary | ICD-10-CM | POA: Diagnosis not present
